# Patient Record
Sex: Female | Born: 1980 | Race: White | Hispanic: No | Smoking: Former smoker
Health system: Southern US, Community
[De-identification: ages and names within clinical notes are randomized; demographics above are authoritative.]

## PROBLEM LIST (undated history)

## (undated) DIAGNOSIS — F419 Anxiety disorder, unspecified: Secondary | ICD-10-CM

## (undated) DIAGNOSIS — Z9889 Other specified postprocedural states: Secondary | ICD-10-CM

## (undated) DIAGNOSIS — M797 Fibromyalgia: Secondary | ICD-10-CM

## (undated) DIAGNOSIS — F32A Depression, unspecified: Secondary | ICD-10-CM

## (undated) DIAGNOSIS — Z3480 Encounter for supervision of other normal pregnancy, unspecified trimester: Secondary | ICD-10-CM

## (undated) DIAGNOSIS — R87619 Unspecified abnormal cytological findings in specimens from cervix uteri: Secondary | ICD-10-CM

## (undated) DIAGNOSIS — R112 Nausea with vomiting, unspecified: Secondary | ICD-10-CM

## (undated) DIAGNOSIS — F329 Major depressive disorder, single episode, unspecified: Secondary | ICD-10-CM

## (undated) DIAGNOSIS — J45909 Unspecified asthma, uncomplicated: Secondary | ICD-10-CM

## (undated) DIAGNOSIS — M5020 Other cervical disc displacement, unspecified cervical region: Secondary | ICD-10-CM

## (undated) DIAGNOSIS — O24419 Gestational diabetes mellitus in pregnancy, unspecified control: Secondary | ICD-10-CM

## (undated) DIAGNOSIS — R51 Headache: Secondary | ICD-10-CM

## (undated) DIAGNOSIS — K589 Irritable bowel syndrome without diarrhea: Secondary | ICD-10-CM

## (undated) DIAGNOSIS — E669 Obesity, unspecified: Secondary | ICD-10-CM

## (undated) DIAGNOSIS — O09299 Supervision of pregnancy with other poor reproductive or obstetric history, unspecified trimester: Secondary | ICD-10-CM

## (undated) DIAGNOSIS — F909 Attention-deficit hyperactivity disorder, unspecified type: Secondary | ICD-10-CM

## (undated) DIAGNOSIS — K219 Gastro-esophageal reflux disease without esophagitis: Secondary | ICD-10-CM

## (undated) DIAGNOSIS — O139 Gestational [pregnancy-induced] hypertension without significant proteinuria, unspecified trimester: Secondary | ICD-10-CM

## (undated) DIAGNOSIS — R87629 Unspecified abnormal cytological findings in specimens from vagina: Secondary | ICD-10-CM

## (undated) DIAGNOSIS — IMO0002 Reserved for concepts with insufficient information to code with codable children: Secondary | ICD-10-CM

## (undated) DIAGNOSIS — G4730 Sleep apnea, unspecified: Secondary | ICD-10-CM

## (undated) HISTORY — DX: Anxiety disorder, unspecified: F41.9

## (undated) HISTORY — DX: Unspecified asthma, uncomplicated: J45.909

## (undated) HISTORY — DX: Headache: R51

## (undated) HISTORY — DX: Fibromyalgia: M79.7

## (undated) HISTORY — DX: Depression, unspecified: F32.A

## (undated) HISTORY — DX: Unspecified abnormal cytological findings in specimens from cervix uteri: R87.619

## (undated) HISTORY — DX: Reserved for concepts with insufficient information to code with codable children: IMO0002

## (undated) HISTORY — DX: Other specified postprocedural states: Z98.890

## (undated) HISTORY — DX: Unspecified abnormal cytological findings in specimens from vagina: R87.629

## (undated) HISTORY — PX: OTHER SURGICAL HISTORY: SHX169

## (undated) HISTORY — DX: Gestational diabetes mellitus in pregnancy, unspecified control: O24.419

## (undated) HISTORY — DX: Irritable bowel syndrome, unspecified: K58.9

## (undated) HISTORY — DX: Attention-deficit hyperactivity disorder, unspecified type: F90.9

## (undated) HISTORY — PX: COLPOSCOPY: SHX161

## (undated) HISTORY — DX: Major depressive disorder, single episode, unspecified: F32.9

## (undated) HISTORY — DX: Obesity, unspecified: E66.9

## (undated) HISTORY — DX: Gestational (pregnancy-induced) hypertension without significant proteinuria, unspecified trimester: O13.9

## (undated) HISTORY — DX: Other cervical disc displacement, unspecified cervical region: M50.20

## (undated) HISTORY — DX: Gastro-esophageal reflux disease without esophagitis: K21.9

## (undated) HISTORY — DX: Other specified postprocedural states: R11.2

## (undated) HISTORY — DX: Supervision of pregnancy with other poor reproductive or obstetric history, unspecified trimester: O09.299

## (undated) HISTORY — DX: Sleep apnea, unspecified: G47.30

---

## 2006-08-31 ENCOUNTER — Ambulatory Visit: Payer: Self-pay | Admitting: Psychiatry

## 2006-08-31 ENCOUNTER — Inpatient Hospital Stay (HOSPITAL_COMMUNITY): Admission: AD | Admit: 2006-08-31 | Discharge: 2006-09-02 | Payer: Self-pay | Admitting: Psychiatry

## 2006-09-03 ENCOUNTER — Other Ambulatory Visit (HOSPITAL_COMMUNITY): Admission: RE | Admit: 2006-09-03 | Discharge: 2006-09-24 | Payer: Self-pay | Admitting: Psychiatry

## 2008-01-27 ENCOUNTER — Ambulatory Visit (HOSPITAL_COMMUNITY): Admission: RE | Admit: 2008-01-27 | Discharge: 2008-01-27 | Payer: Self-pay | Admitting: Obstetrics and Gynecology

## 2008-05-12 ENCOUNTER — Inpatient Hospital Stay (HOSPITAL_COMMUNITY): Admission: AD | Admit: 2008-05-12 | Discharge: 2008-05-15 | Payer: Self-pay | Admitting: Obstetrics and Gynecology

## 2008-05-16 ENCOUNTER — Encounter: Admission: RE | Admit: 2008-05-16 | Discharge: 2008-06-12 | Payer: Self-pay | Admitting: Obstetrics and Gynecology

## 2008-06-13 ENCOUNTER — Encounter: Admission: RE | Admit: 2008-06-13 | Discharge: 2008-07-10 | Payer: Self-pay | Admitting: Obstetrics and Gynecology

## 2010-04-21 ENCOUNTER — Encounter: Payer: Self-pay | Admitting: Physical Medicine and Rehabilitation

## 2010-07-16 LAB — COMPREHENSIVE METABOLIC PANEL
ALT: 12 U/L (ref 0–35)
ALT: 13 U/L (ref 0–35)
AST: 22 U/L (ref 0–37)
AST: 29 U/L (ref 0–37)
Alkaline Phosphatase: 151 U/L — ABNORMAL HIGH (ref 39–117)
BUN: 6 mg/dL (ref 6–23)
CO2: 20 mEq/L (ref 19–32)
Calcium: 8.8 mg/dL (ref 8.4–10.5)
Chloride: 105 mEq/L (ref 96–112)
Chloride: 105 mEq/L (ref 96–112)
Creatinine, Ser: 0.69 mg/dL (ref 0.4–1.2)
GFR calc non Af Amer: >60 mL/min (ref >60)
GFR calc non Af Amer: >60 mL/min (ref >60)
Glucose, Bld: 141 mg/dL — ABNORMAL HIGH (ref 70–99)
Glucose, Bld: 77 mg/dL (ref 70–99)
Potassium: 3.9 mEq/L (ref 3.5–5.1)
Total Bilirubin: 0.2 mg/dL — ABNORMAL LOW (ref 0.3–1.2)
Total Bilirubin: 0.3 mg/dL (ref 0.3–1.2)
Total Protein: 6.5 g/dL (ref 6.0–8.3)

## 2010-07-16 LAB — URINALYSIS, ROUTINE W REFLEX MICROSCOPIC
Glucose, UA: NEGATIVE mg/dL
Ketones, ur: >80 mg/dL — AB
Specific Gravity, Urine: 1.02 (ref 1.005–1.030)
pH: 6.5 (ref 5.0–8.0)

## 2010-07-16 LAB — URINE MICROSCOPIC-ADD ON

## 2010-07-16 LAB — CBC
HCT: 32.3 % — ABNORMAL LOW (ref 36.0–46.0)
HCT: 38.6 % (ref 36.0–46.0)
Hemoglobin: 13.1 g/dL (ref 12.0–15.0)
MCHC: 34.5 g/dL (ref 30.0–36.0)
Platelets: 335 10*3/uL (ref 150–400)
Platelets: 384 10*3/uL (ref 150–400)
RBC: 3.62 MIL/uL — ABNORMAL LOW (ref 3.87–5.11)
RBC: 4.09 MIL/uL (ref 3.87–5.11)
RDW: 14.4 % (ref 11.5–15.5)
RDW: 14.4 % (ref 11.5–15.5)
WBC: 19 10*3/uL — ABNORMAL HIGH (ref 4.0–10.5)

## 2010-07-16 LAB — URINALYSIS, DIPSTICK ONLY
Glucose, UA: NEGATIVE mg/dL
Leukocytes, UA: NEGATIVE
Nitrite: NEGATIVE
Urobilinogen, UA: 0.2 mg/dL (ref 0.0–1.0)

## 2010-07-16 LAB — URIC ACID
Uric Acid, Serum: 5.4 mg/dL (ref 2.4–7.0)
Uric Acid, Serum: 6.3 mg/dL (ref 2.4–7.0)

## 2010-07-16 LAB — RPR: RPR Ser Ql: NONREACTIVE

## 2010-08-05 ENCOUNTER — Other Ambulatory Visit: Payer: Self-pay | Admitting: Allergy and Immunology

## 2010-08-05 ENCOUNTER — Ambulatory Visit
Admission: RE | Admit: 2010-08-05 | Discharge: 2010-08-05 | Disposition: A | Discharge status: Home / Self Care | Payer: Managed Care, Other (non HMO) | Source: Ambulatory Visit | Attending: Allergy and Immunology | Admitting: Allergy and Immunology

## 2010-08-05 DIAGNOSIS — J328 Other chronic sinusitis: Secondary | ICD-10-CM

## 2010-08-13 NOTE — Discharge Summary (Signed)
NAME:  Kristina Bauer, Kristina Bauer               ACCOUNT NO.:  [id number]   MEDICAL RECORD NO.:  [id number]          PATIENT TYPE:  INP   LOCATION:  9101                          FACILITY:  [facility]   PHYSICIAN:  Malachi Pro. Ambrose Mantle, M.D. DATE OF BIRTH:  1980-12-27   DATE OF ADMISSION:  05/12/2008  DATE OF DISCHARGE:  05/15/2008                               DISCHARGE SUMMARY   This is a 30 year old white single female, para 0-0-1-0, gravida 2, EDC  on May 13, 2008, admitted for induction with pregnancy-induced high  blood pressure and possible preeclampsia.  Blood group and type O  positive, negative antibody, nonreactive serology, rubella immune,  hepatitis B surface antigen negative, HIV negative, and GC and chlamydia  negative.  One-hour Glucola 157.  Three-hour GTT 87, 171, 116, and 81.  Group B strep negative.  First trimester screen was negative.  AFP  negative.  Vaginal ultrasound on October 06, 2007, crown-rump length 2.0 cm,  8 weeks 4 days, EDC on May 13, 2008.  Ultrasound on December 20, 2007, average gestational age [redacted] weeks 2 days, [facility] on May 13, 2008.  Echogenic intracardiac focus was noted.  The patient was seen at [facility].  Echogenic intracardiac focus noted or  otherwise normal.  Blood pressure was elevated in our office on the day  of admission with some proteinuria.  The patient was sent to the MAU.  Nonstress test was reactive with 1 variable deceleration.  PIH labs were  normal.  Dr. Ellyn Hack advised induction of labor.   PAST MEDICAL HISTORY:  ALLERGIES:  Allergy to LATEX caused a rash.  ILLNESSES:  Anxiety and depression and herniated cervical disk.   OPERATIONS:  Wisdom teeth extracted.  Alcohol, tobacco, and drugs none.   FAMILY HISTORY:  Father with high blood pressure and diabetes.  Mother  with anxiety and depression.  Maternal grandfather with heart disease.  Paternal grandfather with leukemia and diabetes.   FAMILY HISTORY:  Continued.   Paternal grandmother with colon cancer.  Maternal grandmother with breast cancer and diabetes.   OBSTETRICAL HISTORY:  In 2000, the patient had an early abortion.   PHYSICAL EXAMINATION:  VITAL SIGNS:  On admission, vital signs were  normal.  The blood pressure was 140/90.  HEART:  Normal.  LUNGS:  Normal.  ABDOMEN:  Soft.  Fundal height have been 40 cm on May 05, 2008.  Cervix was 1 plus centimeter, 50% per Dr. Ellyn Hack.   The patient was admitted for Pitocin.  The patient's Pitocin was started  and raised to 5 milliunits a minute.  It was stopped because of variable  decelerations.  At 4:50 a.m. the strip showed many decelerations to  below 100 beats per minute, none lasted longer than 1 minute.  We  restarted the Pitocin by 7:05 a.m.  The Pitocin was a 4 milliunits a  minute.  Contractions were hard to trace.  Variable decelerations were  present.  Cervix was 2 cm, 50% vertex at a -2 to -3 station.  Artificial  rupture of membranes produced clear fluid.  The patient rapidly  reached  full dilatation and received an epidural.  She pushed well and delivered  a living female infant, 7 pounds 4 ounces, Apgars were 8 at 1 and 9 at 5  minutes over a second-degree midline and right sulcus laceration.  Placenta was intact.  Uterus was normal.  Rectal was negative.  Laceration repaired with 2-0 Vicryl.  Blood loss about 400 mL.  Postpartum, the patient did well and was discharged on the second  postpartum day.  Her blood pressure was not an issue.  Urinalysis showed  30 mg percent protein greater than 80 percent ketones, otherwise was  negative.  Initial hemoglobin 13.1, hematocrit 39.6, white count 13,600,  and platelet count 386,000.  PIH labs were done and were normal.  Followup hemoglobins were 11.8 and 11.1.  PIH labs were done twice and  were normal both times.   FINAL DIAGNOSES:  1. Intrauterine pregnancy at term with pregnancy-induced high blood      pressure, possible  preeclampsia, delivered vertex.  2. Operation, spontaneous delivery vertex.  3. Repair of second-degree midline and right sulcus laceration.   FINAL CONDITION:  Improved.   INSTRUCTIONS:  Include our regular discharge instruction booklet.  The  patient is given a prescription for Motrin 600 mg 30 tablets 1 every 6  hours as needed for pain and Anusol-HC cream 30 g to apply locally twice  a day to the hemorrhoids.  She is to return in 6 weeks for followup  examination.      Malachi Pro. Ambrose Mantle, M.D.  Electronically Signed     TFH/MEDQ  D:  05/15/2008  T:  05/15/2008  Job:  [id number]

## 2010-09-16 ENCOUNTER — Inpatient Hospital Stay (INDEPENDENT_AMBULATORY_CARE_PROVIDER_SITE_OTHER)
Admission: RE | Admit: 2010-09-16 | Discharge: 2010-09-16 | Disposition: A | Discharge status: Home / Self Care | Payer: Managed Care, Other (non HMO) | Source: Ambulatory Visit | Attending: Emergency Medicine | Admitting: Emergency Medicine

## 2010-09-16 DIAGNOSIS — J019 Acute sinusitis, unspecified: Secondary | ICD-10-CM

## 2010-09-16 DIAGNOSIS — J310 Chronic rhinitis: Secondary | ICD-10-CM

## 2011-01-16 LAB — COMPREHENSIVE METABOLIC PANEL
ALT: 14
AST: 20
Albumin: 4.4
Alkaline Phosphatase: 49
BUN: 5 — ABNORMAL LOW
CO2: 28
Calcium: 9.4
Chloride: 100
Creatinine, Ser: 0.79
GFR calc Af Amer: >60
GFR calc non Af Amer: >60
Glucose, Bld: 96
Potassium: 3.3 — ABNORMAL LOW
Sodium: 138
Total Bilirubin: 0.8
Total Protein: 7.9

## 2011-01-16 LAB — URINALYSIS, ROUTINE W REFLEX MICROSCOPIC
Glucose, UA: NEGATIVE
Hgb urine dipstick: NEGATIVE
Ketones, ur: NEGATIVE
Leukocytes, UA: NEGATIVE
Nitrite: POSITIVE — AB
Protein, ur: NEGATIVE
Specific Gravity, Urine: 1.031 — ABNORMAL HIGH
Urobilinogen, UA: 1
pH: 5.5

## 2011-01-16 LAB — URINE MICROSCOPIC-ADD ON

## 2011-01-16 LAB — DRUGS OF ABUSE SCREEN W/O ALC, ROUTINE URINE
Amphetamine Screen, Ur: NEGATIVE
Barbiturate Quant, Ur: NEGATIVE
Propoxyphene: NEGATIVE

## 2011-01-16 LAB — CBC
HCT: 44.3
Hemoglobin: 15.3 — ABNORMAL HIGH
MCHC: 34.5
MCV: 93.7
Platelets: 354
RBC: 4.73
RDW: 11.8
WBC: 9.8

## 2011-01-16 LAB — TSH: TSH: 1.077

## 2011-04-23 ENCOUNTER — Ambulatory Visit: Payer: Managed Care, Other (non HMO) | Admitting: Family

## 2011-04-24 ENCOUNTER — Telehealth: Payer: Self-pay | Admitting: Family

## 2011-04-24 ENCOUNTER — Ambulatory Visit (INDEPENDENT_AMBULATORY_CARE_PROVIDER_SITE_OTHER): Payer: Managed Care, Other (non HMO) | Admitting: Family

## 2011-04-24 ENCOUNTER — Encounter: Payer: Self-pay | Admitting: Family

## 2011-04-24 VITALS — BP 108/78 | HR 82 | Temp 98.0°F | Resp 16 | Ht 64.25 in | Wt 230.0 lb

## 2011-04-24 DIAGNOSIS — R209 Unspecified disturbances of skin sensation: Secondary | ICD-10-CM

## 2011-04-24 DIAGNOSIS — R200 Anesthesia of skin: Secondary | ICD-10-CM

## 2011-04-24 DIAGNOSIS — G589 Mononeuropathy, unspecified: Secondary | ICD-10-CM

## 2011-04-24 DIAGNOSIS — M25449 Effusion, unspecified hand: Secondary | ICD-10-CM

## 2011-04-24 DIAGNOSIS — M7989 Other specified soft tissue disorders: Secondary | ICD-10-CM

## 2011-04-24 LAB — CBC WITH DIFFERENTIAL/PLATELET
Basophils Absolute: 0 10*3/uL (ref 0.0–0.1)
Eosinophils Absolute: 0.1 10*3/uL (ref 0.0–0.7)
HCT: 39.5 % (ref 36.0–46.0)
Hemoglobin: 13.7 g/dL (ref 12.0–15.0)
Lymphocytes Relative: 39.5 % (ref 12.0–46.0)
Lymphs Abs: 2.3 10*3/uL (ref 0.7–4.0)
MCHC: 34.6 g/dL (ref 30.0–36.0)
Monocytes Relative: 9.3 % (ref 3.0–12.0)
Neutro Abs: 2.9 10*3/uL (ref 1.4–7.7)
Platelets: 407 10*3/uL — ABNORMAL HIGH (ref 150.0–400.0)
RDW: 12.7 % (ref 11.5–14.6)

## 2011-04-24 LAB — BASIC METABOLIC PANEL
CO2: 23 mEq/L (ref 19–32)
Chloride: 109 mEq/L (ref 96–112)
Creatinine, Ser: 0.7 mg/dL (ref 0.4–1.2)
GFR: 103.25 mL/min (ref >60.00)
Glucose, Bld: 95 mg/dL (ref 70–99)
Potassium: 4 mEq/L (ref 3.5–5.1)
Sodium: 141 mEq/L (ref 135–145)

## 2011-04-24 LAB — TSH: TSH: 0.83 u[IU]/mL (ref 0.35–5.50)

## 2011-04-24 NOTE — Telephone Encounter (Signed)
Notified pt. 

## 2011-04-24 NOTE — Patient Instructions (Signed)
Cervical Radiculopathy Cervical radiculopathy is a pinched nerve in the neck. When this happens you may have pain or numbness shooting from your neck all the way down into your arm and fingers. There are many causes of this problem. Sometimes this may happen from an injury or simply from muscle tightness in the neck from overuse. It may also happen from arthritis or bone problems. If there is no improvement after treatment, further studies may be done to find the exact cause. DIAGNOSIS  X-rays may be needed if the problems become long standing. Electromyograms may be done. This study is one in which the working of nerves and muscles is studied. HOME CARE INSTRUCTIONS   Applications of ice packs may be helpful. Ice can be used in a plastic bag with a towel around it to prevent frostbite to skin. This may be used every 2 hours for 20 to 30 minutes, or as needed, while awake or as directed by your caregiver.   Sleep at night with a flat pillow.   Only take over-the-counter or prescription medicines for pain, discomfort, or fever as directed by your caregiver.   If physical therapy was prescribed, follow your caregiver's directions. If a cervical collar or cervical traction device was prescribed, use them as directed.  SEEK IMMEDIATE MEDICAL CARE IF:   You have pain not controlled with medications.   You seem to be getting worse rather than better.   You develop weakness in your hand or arm.   You develop new numbness or loss of feeling in your arms or legs.   You develop loss of bowel or bladder control.   You have difficulty with walking or balance, or develop clumsiness in the use of your arms or legs.  MAKE SURE YOU:   Understand these instructions.   Will watch your condition.   Will get help right away if you are not doing well or get worse.  Document Released: 12/10/2000 Document Revised: 09/30/2010 Document Reviewed: 07/21/2007 [employer] Patient Information 2012 [employer]. 

## 2011-04-24 NOTE — Telephone Encounter (Signed)
LMTCB

## 2011-04-24 NOTE — Progress Notes (Signed)
Subjective:    Patient ID: Kristina Bauer, female    DOB: 06-30-1980, 31 y.o.   MRN: [id number]  HPI 31 year old white female, new patient to the practice presents to be established. She has a history of a pinched nerve in her neck for which [facility]. She is currently taking naproxen 100 mg twice daily, tramadol 50 mg when necessary methocarbamol 500 mg at bedtime, and oral contraceptive pills.  Patient has concerns of bilateral hand numbness the right hand worse than the left considerable amount for several weeks, affecting her work. 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 She reports occasionally swollen joints but denies any warmness to touch. Patient also reports hair loss that's been going on for several weeks. Denies any past medical history of any thyroid disease. Denies any lightheadedness, dizziness, chest pain, palpitations, shortness of breath or edema.   As of note, patient does have a past medical history of depression and anxiety that require hospitalization and medications. She is not currently on any medicine  Review of Systems  Constitutional: Negative.   HENT: Positive for neck pain.   Respiratory: Negative.   Cardiovascular: Negative.   Musculoskeletal: Positive for joint swelling.       Hands  Neurological: Positive for numbness.       Hands bilaterally  Hematological: Negative.   Psychiatric/Behavioral: The patient is nervous/anxious.    No past medical history on file.  History   Social History  . Marital Status: Single    Spouse Name: N/A    Number of Children: N/A  . Years of Education: N/A   Occupational History  . Not on file.   Social History Main Topics  . Smoking status: Former Games developer  . Smokeless tobacco: Not on file  . Alcohol Use: Not on file  . Drug Use: Not on file  . Sexually Active: Not on file   Other Topics Concern  . Not on file   Social History Narrative  . No  narrative on file    No past surgical history on file.  No family history on file.  No Known Allergies  No current outpatient prescriptions on file prior to visit.    BP 108/78  Pulse 82  Temp(Src) 98 F (36.7 C) (Oral)  Resp 16  Ht 5' 4.25" (1.632 m)  Wt 230 lb (104.327 kg)  BMI 39.17 kg/m2  SpO2 98%chart    Objective:   Physical Exam  Constitutional: She is oriented to person, place, and time. She appears well-developed and well-nourished.  HENT:  Right Ear: External ear normal.  Left Ear: External ear normal.  Nose: Nose normal.  Mouth/Throat: Oropharynx is clear and moist.  Neck: Normal range of motion. Neck supple.  Cardiovascular: Normal rate, regular rhythm and normal heart sounds.   Pulmonary/Chest: Effort normal and breath sounds normal.  Abdominal: Soft. Bowel sounds are normal.  Musculoskeletal:       Pain to palpation of the cervical spine. Pain with range of motion. Both hands are normal normal grip strength. Radial pulses 2 out of 2.  Neurological: She is alert and oriented to person, place, and time.  Skin: Skin is warm and dry.  Psychiatric: She has a normal mood and affect.          Assessment & Plan:  Assessment: Multiple joint pain, cervical radiculopathy, bilateral hand numbness  Plan: Lab said to include ANA, RA, BMP, CBC, TSH Will notify patient of  the results of her labs. Encouraged healthy diet and exercise. Neck exercises. Patient is continue treatment through the [facility]. We will followup and manage any treatment conditions outside of her chronic neck and back pain.

## 2011-04-24 NOTE — Telephone Encounter (Signed)
The spine center is taking care of her neck pain. If she is under the care of a specialist, they should address her neck issue. I am not sure what we can offer her.

## 2011-04-24 NOTE — Telephone Encounter (Signed)
Pt called and was in as a np to see [name] re: pinched nerve. Pt called the doctor at the [facility] today and was told that there was nothing that they could do for the pts neck and that she would need to go to Urgent Care. Pt req call back from nurse.

## 2011-04-25 LAB — HIV ANTIBODY (ROUTINE TESTING W REFLEX): HIV: NONREACTIVE

## 2011-04-25 LAB — ANA: Anti Nuclear Antibody(ANA): NEGATIVE

## 2011-04-30 ENCOUNTER — Other Ambulatory Visit: Payer: Self-pay | Admitting: Orthopedic Surgery

## 2011-04-30 DIAGNOSIS — M542 Cervicalgia: Secondary | ICD-10-CM

## 2011-05-03 ENCOUNTER — Ambulatory Visit
Admission: RE | Admit: 2011-05-03 | Discharge: 2011-05-03 | Disposition: A | Discharge status: Home / Self Care | Payer: Managed Care, Other (non HMO) | Source: Ambulatory Visit | Attending: Orthopedic Surgery | Admitting: Orthopedic Surgery

## 2011-05-03 DIAGNOSIS — M542 Cervicalgia: Secondary | ICD-10-CM

## 2011-05-13 ENCOUNTER — Encounter: Payer: Self-pay | Admitting: Family

## 2011-05-13 ENCOUNTER — Ambulatory Visit (INDEPENDENT_AMBULATORY_CARE_PROVIDER_SITE_OTHER): Payer: Managed Care, Other (non HMO) | Admitting: Family

## 2011-05-13 VITALS — BP 126/98 | Temp 97.8°F | Wt 240.0 lb

## 2011-05-13 DIAGNOSIS — F418 Other specified anxiety disorders: Secondary | ICD-10-CM

## 2011-05-13 DIAGNOSIS — F341 Dysthymic disorder: Secondary | ICD-10-CM

## 2011-05-13 DIAGNOSIS — K219 Gastro-esophageal reflux disease without esophagitis: Secondary | ICD-10-CM

## 2011-05-13 MED ORDER — OMEPRAZOLE 40 MG PO CPDR: 40.0000 mg | DELAYED_RELEASE_CAPSULE | Freq: Every day | ORAL | Status: DC

## 2011-05-13 MED ORDER — DULOXETINE HCL 30 MG PO CPEP: 30.0000 mg | ORAL_CAPSULE | Freq: Every day | ORAL | Status: DC

## 2011-05-13 NOTE — Progress Notes (Signed)
Subjective:    Patient ID: Kristina Bauer, female    DOB: Aug 10, 1980, 31 y.o.   MRN: [id number]  HPI The 31 year old white female who nonsmoker, is in for management of her depression and anxiety. Patient has a long-standing history of depression and anxiety is not currently on any medications. She also has a history of chronic neck pain and is being monitored by the pain management clinic and physical therapy. She denies any frontal helplessness or hopelessness, no thoughts of death or dying. Patient does have crying spells. She also believes that she has an eating disorder, eating excessively for comfort. She's not currently exercising or following any particular diet. She is requesting psychotherapy.  Patient also has a history of GERD but has not been treated. She reports daily episodes of abdominal pressure and bloating. She also reports a sore throat and painful swallowing. She has episodes of diarrhea and constipation that has not. She denies any nausea or vomiting.  Review of Systems  Constitutional: Negative.   HENT: Positive for sore throat. Negative for congestion, rhinorrhea, sneezing and postnasal drip.   Respiratory: Negative.   Cardiovascular: Negative.   Gastrointestinal: Positive for abdominal pain.  Musculoskeletal: Positive for arthralgias.       Chronic neck pain  Skin: Negative.   Neurological: Negative.   Psychiatric/Behavioral: The patient is nervous/anxious.    No past medical history on file.  History   Social History  . Marital Status: Single    Spouse Name: N/A    Number of Children: N/A  . Years of Education: N/A   Occupational History  . Not on file.   Social History Main Topics  . Smoking status: Former Games developer  . Smokeless tobacco: Not on file  . Alcohol Use: Not on file  . Drug Use: Not on file  . Sexually Active: Not on file   Other Topics Concern  . Not on file   Social History Narrative  . No narrative on file    No past surgical  history on file.  No family history on file.  No Known Allergies  Current Outpatient Prescriptions on File Prior to Visit  Medication Sig Dispense Refill  . methocarbamol (ROBAXIN) 500 MG tablet Take 500 mg by mouth as needed.      . naproxen (NAPROSYN) 500 MG tablet Take 500 mg by mouth 2 (two) times daily with a meal.      . NON FORMULARY Take by mouth daily. Portia BC.      . traMADol (ULTRAM) 50 MG tablet Take 50 mg by mouth every 6 (six) hours as needed.        BP 126/98  Temp(Src) 97.8 F (36.6 C) (Oral)  Wt 240 lb (108.863 kg)chart and     Objective:   Physical Exam  Constitutional: She is oriented to person, place, and time. She appears well-developed and well-nourished.  Neck: Normal range of motion. Neck supple.  Cardiovascular: Normal rate, regular rhythm and normal heart sounds.   Pulmonary/Chest: Effort normal and breath sounds normal.  Abdominal: Soft. Bowel sounds are normal.  Neurological: She is alert and oriented to person, place, and time.  Skin: Skin is warm and dry.          Assessment & Plan:  Assessment: GERD, anxiety and depression  Plan: Start Cymbalta 30 mg by mouth daily x1 week then increase to 60 mg by mouth daily to help with anxiety and depression as well as her chronic pain. We'll start omeprazole 40 mg  once daily in the morning to help with GERD. Also gave instructions on dietary changes. Encouraged healthy diet and exercise. Patient will consult with her insurance company to determine what therapies are all formulary for her and schedule an appointment to further discuss her anxiety and depression as well as the possibility of the eating disorder.

## 2011-05-13 NOTE — Patient Instructions (Addendum)
Depression, Adolescent and Adult Depression is a true and treatable medical condition. In general there are two kinds of depression:  Depression we all experience in some form. For example depression from the death of a loved one, financial distress or natural disasters will trigger or increase depression.   Clinical depression, on the other hand, appears without an apparent cause or reason. This depression is a disease. Depression may be caused by chemical imbalance in the body and brain or may come as a response to a physical illness. Alcohol and other drugs can cause depression.  DIAGNOSIS  The diagnosis of depression is usually based upon symptoms and medical history. TREATMENT  Treatments for depression fall into three categories. These are:  Drug therapy. There are many medicines that treat depression. Responses may vary and sometimes trial and error is necessary to determine the best medicines and dosage for a particular patient.   Psychotherapy, also called talking treatments, helps people resolve their problems by looking at them from a different point of view and by giving people insight into their own personal makeup. Traditional psychotherapy looks at a childhood source of a problem. Other psychotherapy will look at current conflicts and move toward solving those. If the cause of depression is drug use, counseling is available to help abstain. In time the depression will usually improve. If there were underlying causes for the chemical use, they can be addressed.   ECT (electroconvulsive therapy) or shock treatment is not as commonly used today. It is a very effective treatment for severe suicidal depression. During ECT electrical impulses are applied to the head. These impulses cause a generalized seizure. It can be effective but causes a loss of memory for recent events. Sometimes this loss of memory may include the last several months.  Treat all depression or suicide threats as  serious. Obtain professional help. Do not wait to see if serious depression will get better over time without help. Seek help for yourself or those around you. In the [place] the number to the National Suicide Help Lines With 24 Hour Help Are: 1-800-SUICIDE (737)243-2677 Document Released: 03/14/2000 Document Revised: 11/27/2010 Document Reviewed: 11/03/2007 [facility] Patient Information 2012 [place], [place].  Diet for GERD or PUD Nutrition therapy can help ease the discomfort of gastroesophageal reflux disease (GERD) and peptic ulcer disease (PUD).  HOME CARE INSTRUCTIONS   Eat your meals slowly, in a relaxed setting.   Eat 5 to 6 small meals per day.   If a food causes distress, stop eating it for a period of time.  FOODS TO AVOID  Coffee, regular or decaffeinated.   Cola beverages, regular or low calorie.   Tea, regular or decaffeinated.   Pepper.   Cocoa.   High fat foods, including meats.   Butter, margarine, hydrogenated oil (trans fats).   Peppermint or spearmint (if you have GERD).   Fruits and vegetables if not tolerated.   Alcohol.   Nicotine (smoking or chewing). This is one of the most potent stimulants to acid production in the gastrointestinal tract.   Any food that seems to aggravate your condition.  If you have questions regarding your diet, ask your caregiver or a registered dietitian. TIPS  Lying flat may make symptoms worse. Keep the head of your bed raised 6 to 9 inches (15 to 23 cm) by using a foam wedge or blocks under the legs of the bed.   Do not lay down until 3 hours after eating a meal.   Daily physical activity  may help reduce symptoms.  MAKE SURE YOU:   Understand these instructions.   Will watch your condition.   Will get help right away if you are not doing well or get worse.  Document Released: 03/17/2005 Document Revised: 11/27/2010 Document Reviewed: 07/31/2008 [facility] Patient Information 2012 [place], [place].

## 2011-06-03 ENCOUNTER — Encounter: Payer: Self-pay | Admitting: Family

## 2011-06-03 ENCOUNTER — Ambulatory Visit (INDEPENDENT_AMBULATORY_CARE_PROVIDER_SITE_OTHER): Payer: Managed Care, Other (non HMO) | Admitting: Family

## 2011-06-03 VITALS — BP 128/90 | Temp 98.2°F | Wt 240.0 lb

## 2011-06-03 DIAGNOSIS — K219 Gastro-esophageal reflux disease without esophagitis: Secondary | ICD-10-CM

## 2011-06-03 DIAGNOSIS — F341 Dysthymic disorder: Secondary | ICD-10-CM

## 2011-06-03 DIAGNOSIS — F329 Major depressive disorder, single episode, unspecified: Secondary | ICD-10-CM

## 2011-06-03 DIAGNOSIS — F988 Other specified behavioral and emotional disorders with onset usually occurring in childhood and adolescence: Secondary | ICD-10-CM

## 2011-06-03 NOTE — Progress Notes (Signed)
Subjective:    Patient ID: Kristina Bauer, female    DOB: 1980-10-13, 31 y.o.   MRN: [id number]  HPI 31 year old white female, nonsmoker, anxiety, depression and GERD. She's currently taking Cymbalta 60 mg a day. Omeprazole 40 mg a day. Patient is tolerating her medications well. Since seeing me, she seen psychiatry and the therapist to have continued her medications but also diagnosed her with attention deficit disorder. Her psychiatrist also believes that she may have fibromyalgia. She's also seeing the pain clinic is managing her chronic pain condition. Patient denies any feelings of helplessness, hopelessness no thoughts of death or dying. Assess    Review of Systems  Constitutional: Negative.   Respiratory: Negative.   Cardiovascular: Negative.   Genitourinary: Negative.   Musculoskeletal: Negative.   Neurological: Negative.   Psychiatric/Behavioral: Negative.  The patient is not nervous/anxious.    No past medical history on file.  History   Social History  . Marital Status: Single    Spouse Name: N/A    Number of Children: N/A  . Years of Education: N/A   Occupational History  . Not on file.   Social History Main Topics  . Smoking status: Former Games developer  . Smokeless tobacco: Not on file  . Alcohol Use: Not on file  . Drug Use: Not on file  . Sexually Active: Not on file   Other Topics Concern  . Not on file   Social History Narrative  . No narrative on file    No past surgical history on file.  No family history on file.  No Known Allergies  Current Outpatient Prescriptions on File Prior to Visit  Medication Sig Dispense Refill  . DULoxetine (CYMBALTA) 30 MG capsule Take 1 capsule (30 mg total) by mouth daily. 1 tab po QD x 7 days then increase to 2 tabs.  30 capsule  0  . methocarbamol (ROBAXIN) 500 MG tablet Take 500 mg by mouth as needed.      Marland Kitchen omeprazole (PRILOSEC) 40 MG capsule Take 1 capsule (40 mg total) by mouth daily.  30 capsule  3  .  gabapentin (NEURONTIN) 300 MG capsule       . naproxen (NAPROSYN) 500 MG tablet Take 500 mg by mouth 2 (two) times daily with a meal.      . NON FORMULARY Take by mouth daily. Portia BC.      Marland Kitchen PORTIA-28 0.15-30 MG-MCG tablet       . traMADol (ULTRAM) 50 MG tablet Take 50 mg by mouth every 6 (six) hours as needed.      Marland Kitchen TREXIMET 85-500 MG per tablet         BP 128/90  Temp(Src) 98.2 F (36.8 C) (Oral)  Wt 240 lb (108.863 kg)chart    Objective:   Physical Exam  Constitutional: She is oriented to person, place, and time. She appears well-developed and well-nourished.  HENT:  Right Ear: External ear normal.  Left Ear: External ear normal.  Nose: Nose normal.  Mouth/Throat: Oropharynx is clear and moist.  Neck: Normal range of motion. Neck supple.  Cardiovascular: Normal rate, regular rhythm and normal heart sounds.   Pulmonary/Chest: Effort normal and breath sounds normal.  Abdominal: Soft. Bowel sounds are normal.  Musculoskeletal: Normal range of motion.  Neurological: She is alert and oriented to person, place, and time.  Skin: Skin is warm and dry.  Psychiatric: She has a normal mood and affect.          Assessment &  Plan:  Assessment: Anxiety, depression, GERD, and attention deficit disorder  Plan: Continue all medications as prescribed. The patient is following up with psychiatry, therapies, and the pain Center, we will recheck her here in 6 months and sooner when necessary.

## 2011-06-12 LAB — OB RESULTS CONSOLE GBS: GBS: NEGATIVE

## 2011-08-12 ENCOUNTER — Telehealth: Payer: Self-pay | Admitting: *Deleted

## 2011-08-12 NOTE — Telephone Encounter (Signed)
Pt is taking omeprazole and her acid reflux is getting worse.  She looses her medical insurance on 08/29/11.  She wants to know if Oran Rein will give her something else or if she needs to come in for another OV

## 2011-08-13 ENCOUNTER — Other Ambulatory Visit: Payer: Self-pay | Admitting: Family

## 2011-08-13 MED ORDER — ESOMEPRAZOLE MAGNESIUM 40 MG PO CPDR: 40.0000 mg | DELAYED_RELEASE_CAPSULE | Freq: Every day | ORAL | Status: DC

## 2011-08-13 NOTE — Telephone Encounter (Signed)
I will fax a RX for nexium 40mg  a day.

## 2011-08-19 ENCOUNTER — Telehealth: Payer: Self-pay

## 2011-08-19 NOTE — Telephone Encounter (Signed)
Triage VM:  Pt called and states she was given Nexium for acid reflux but pt would like to make sure it is a medication on the preferred [employer] List due to losing health insurance on 08/29/11.  Pls advise.

## 2011-08-19 NOTE — Telephone Encounter (Signed)
Nexium is not on [employer]'s $4 list. Zantac is but is a much weaker medication. Can use OTC Prilosec if necessary.

## 2011-08-19 NOTE — Telephone Encounter (Signed)
Pt aware.

## 2011-08-20 NOTE — Telephone Encounter (Signed)
Left a message for pt to return call 

## 2011-08-22 NOTE — Telephone Encounter (Signed)
Left a message for pt to return call 

## 2011-12-04 ENCOUNTER — Ambulatory Visit: Payer: Managed Care, Other (non HMO) | Admitting: Family

## 2011-12-24 LAB — OB RESULTS CONSOLE RPR: RPR: NONREACTIVE

## 2011-12-24 LAB — OB RESULTS CONSOLE ANTIBODY SCREEN: Antibody Screen: NEGATIVE

## 2011-12-24 LAB — OB RESULTS CONSOLE GC/CHLAMYDIA: Gonorrhea: NEGATIVE

## 2011-12-24 LAB — OB RESULTS CONSOLE HEPATITIS B SURFACE ANTIGEN: Hepatitis B Surface Ag: NEGATIVE

## 2011-12-24 LAB — OB RESULTS CONSOLE ABO/RH: RH Type: POSITIVE

## 2012-01-09 ENCOUNTER — Encounter: Payer: Self-pay | Admitting: *Deleted

## 2012-01-09 ENCOUNTER — Encounter: Payer: Medicaid Other | Attending: Obstetrics and Gynecology | Admitting: *Deleted

## 2012-01-09 DIAGNOSIS — Z713 Dietary counseling and surveillance: Secondary | ICD-10-CM | POA: Insufficient documentation

## 2012-01-09 DIAGNOSIS — E669 Obesity, unspecified: Secondary | ICD-10-CM | POA: Insufficient documentation

## 2012-01-09 NOTE — Patient Instructions (Addendum)
Plan: Aim for 3 Carb choices (45 grams) per meal +/- 1  Aim for 1-2 Carbs per snack if hungry Consider having a protein source with meals and snacks as able Consider trying Carnation Essential as meal replacement option Continue walking as able daily  Consider reading food labels for total carb and fat grams of foods

## 2012-01-09 NOTE — Progress Notes (Signed)
  Medical Nutrition Therapy:  Appt start time: 0800 end time:  0900.   Assessment:  Primary concerns today: patient here for weight management during pregnancy. She is [redacted] weeks pregnant and has some queasy moments that effect food choices. She is under a tremendous amount of stress with being unemployed and her benefits may be expiring soon, she has past medical bills to pay off and she states she has been to over 50 interviews with no success in getting a job.   MEDICATIONS: see list   DIETARY INTAKE:  Usual eating pattern includes 2-3 meals and 0-2 snacks per day.  Everyday foods include variety of all food groups.  Avoided foods include plain milk, most meats right now, some vegetables.    24-hr recall:  B ( AM): tries to eat; dry cereal - sweetened or unsweetened without milk, occasionally OJ or water Snk ( AM): none  L ( PM): fast food; nacho [place] and taco, regular soda Snk ( PM): none D ( PM): some meat occasionally, beans, occasionally a vegetable,  Snk ( PM): occasionally chips Beverages: water, some juice, regular soda  Usual physical activity: tries to walk and go to gym walking on treadmill and stationary bike as able limited by migraines and neck pain  Estimated energy needs: 2000 calories 225 g carbohydrates 150 g protein 56 g fat  Progress Towards Goal(s):  In progress.   Nutritional Diagnosis:  NB-1.1 Food and nutrition-related knowledge deficit As related to weight maintenance during pregnancy.  As evidenced by BMI of 42.7 .    Intervention:  Nutrition counseling for weight maintenance during pregnancy initiated. Discussed Carb Counting as well as value of Protein and Fat, the reading of food labels, and benefits of consistent daily activity. Reviewed how her usual fast food choices add up with carb and fat content and what some alternative choices might be. Also discussed grocery shopping list and value of using [employer]orie [employer] APP for nutrition  information.  Plan: Aim for 3 Carb choices (45 grams) per meal +/- 1  Aim for 1-2 Carbs per snack if hungry Consider having a protein source with meals and snacks as able Consider trying Carnation Essential as meal replacement option Continue walking as able daily  Consider reading food labels for total carb and fat grams of foods  Handouts given during visit include: Carb Counting and Food Label handouts Meal Plan Card  APP for [employer]orie [facility]  Monitoring/Evaluation:  Dietary intake, exercise, reading food labels, and body weight in 4 week(s).

## 2012-02-09 ENCOUNTER — Ambulatory Visit: Payer: Medicaid Other | Admitting: *Deleted

## 2012-03-31 NOTE — L&D Delivery Note (Signed)
Delivery Note At 12:30 PM a viable female was delivered via Vaginal, Spontaneous Delivery (Presentation: Left Occiput Anterior).  APGAR: 9, 9; weight P.   Placenta status: Intact, Spontaneous.  Cord: 3 vessels with the following complications: None.    Anesthesia: Epidural  Episiotomy: None Lacerations: 2nd degree Suture Repair: 3.0 vicryl rapide Est. Blood Loss (mL): 400  Mom to postpartum.  Baby to stay with mommy and daddy.  Bauer,Kristina Obremski 07/09/2012, 12:49 PM  Br/O+/IUD PP

## 2012-06-04 LAB — OB RESULTS CONSOLE GBS: GBS: NEGATIVE

## 2012-06-25 ENCOUNTER — Telehealth: Payer: Self-pay | Admitting: Neurology

## 2012-06-25 NOTE — Telephone Encounter (Signed)
Patient called and left voice massage on [employer]. She is [redacted] weeks gestation and went in today for an MRI today but could not complete it because she could not lie flat long enough due to her pregnancy status. Patient is concerned that she is on Pregnancy Medicaid and is due April 14 and has an appointment with Dr. Pearlean Brownie on July 20, 2012 and does not know if she will still have her Pregnancy Medicaid if she has to delay her neuro appointment if she has to delay her MRI.   I called and spoke with the patient and advised her that her medicaid is effective for six weeks post-partum. She will try to reschedule the MRI for July 19, 2012 or as close to that date as possible. If she has to reschedule her appointment with [facility], she will call us back and we will have to keep in consideration that her appointment has to be made within that six week window of her delivery date.

## 2012-06-25 NOTE — Telephone Encounter (Signed)
Note sent to [facility] in MRI re: rescheduling.

## 2012-06-30 ENCOUNTER — Telehealth: Payer: Self-pay | Admitting: Neurology

## 2012-06-30 ENCOUNTER — Other Ambulatory Visit: Payer: Self-pay | Admitting: Neurology

## 2012-06-30 DIAGNOSIS — F410 Panic disorder [episodic paroxysmal anxiety] without agoraphobia: Secondary | ICD-10-CM

## 2012-06-30 MED ORDER — ALPRAZOLAM 0.5 MG PO TABS: 0.5000 mg | ORAL_TABLET | Freq: Every evening | ORAL | Status: DC | PRN

## 2012-06-30 NOTE — Telephone Encounter (Signed)
Pt called again and is asking about her MRI being rescheduled after her having her baby and from her OB stating this could happen this week or next.  She stated that she will need xanax due to claustrophobia. (taking this after she has baby, but she may breastfeed).  Please advise.   Pt to call and reschedule appt. For MRI

## 2012-06-30 NOTE — Telephone Encounter (Signed)
I prescibed xanax 0.5 mg two tablets

## 2012-07-01 NOTE — Telephone Encounter (Signed)
I called pt and she will p/u at her pharmacy later this afternoon.  Richeda had called pt already.

## 2012-07-05 ENCOUNTER — Telehealth (HOSPITAL_COMMUNITY): Payer: Self-pay | Admitting: *Deleted

## 2012-07-05 ENCOUNTER — Encounter (HOSPITAL_COMMUNITY): Payer: Self-pay | Admitting: *Deleted

## 2012-07-05 NOTE — Telephone Encounter (Signed)
Preadmission screen  

## 2012-07-08 ENCOUNTER — Encounter (HOSPITAL_COMMUNITY): Payer: Self-pay

## 2012-07-08 DIAGNOSIS — Z3480 Encounter for supervision of other normal pregnancy, unspecified trimester: Secondary | ICD-10-CM

## 2012-07-08 HISTORY — DX: Encounter for supervision of other normal pregnancy, unspecified trimester: Z34.80

## 2012-07-08 MED ORDER — PRENATAL 27-0.8 MG PO TABS
1.0000 | ORAL_TABLET | Freq: Every day | ORAL | Status: DC
  Filled 2012-07-08 (×2): qty 1

## 2012-07-08 MED ORDER — PANTOPRAZOLE SODIUM 40 MG PO TBEC
40.0000 mg | DELAYED_RELEASE_TABLET | Freq: Every day | ORAL | Status: DC
  Filled 2012-07-08 (×3): qty 1

## 2012-07-08 NOTE — H&P (Signed)
Kristina Bauer is a 32 y.o. female G3P1011 at 39+ for IOL given term status and favorable cervix.  +FM, no LOF, no VB, occ ctx; pregnancy relatively uncomplicated, maternal h/o fibromyalgia, Adderall use, HA/migraine, IBS, fibromyalgoa, ADHD, Depression, herniated disc - cervical, GERD.  D/w pt r/b/a of IOL and POC.  Maternal Medical History:  Contractions: Frequency: irregular.   Perceived severity is moderate.    Fetal activity: Perceived fetal activity is normal.    Prenatal complications: no prenatal complications Prenatal Complications - Diabetes: none.    OB History   Grav Para Term Preterm Abortions TAB SAB Ect Mult Living   3 1 1  1  1   1     G1 TAB, G2 SVD 7#4 female, PIH, PreE, G3 present, h/o abn pap, no STDs  Past Medical History  Diagnosis Date  . GERD (gastroesophageal reflux disease)   . Obesity   . Anxiety   . Depression   . Hx of pre-eclampsia in prior pregnancy, currently pregnant   . Fibromyalgia   . IBS (irritable bowel syndrome)   . Abnormal Pap smear   . ADHD (attention deficit hyperactivity disorder)   . Asthma   . Headache   . Herniated disc, cervical   . Normal pregnancy, repeat 07/08/2012   Past Surgical History  Procedure Laterality Date  . Wisdom teeth extracted    . Colposcopy     Family History: family history includes Anxiety disorder in her mother; Cancer in her father, maternal grandmother, paternal grandfather, and paternal grandmother; Depression in her mother; Diabetes in her father, maternal grandmother, and paternal grandfather; Fibromyalgia in her sister; Heart disease in her maternal grandfather; and Hypertension in her father and mother. Social History:  reports that she quit smoking about 4 years ago. She has never used smokeless tobacco. She reports that she does not drink alcohol or use illicit drugs.single Meds cyclobenzaprene, PNV,Protonix All Latex, NKDA   Prenatal Transfer Tool  Maternal Diabetes: No Genetic Screening:  Normal Maternal Ultrasounds/Referrals: Normal Fetal Ultrasounds or other Referrals:  None Maternal Substance Abuse:  No Significant Maternal Medications:  None Significant Maternal Lab Results:  Lab values include: Group B Strep negative Other Comments:  Multiple maternal medical problems: h/o migraine, fibromyalgia, GERD, ADHD, Deoression, Adderall use, Asthma, IBS  Review of Systems  Constitutional: Negative.   HENT: Negative.   Eyes: Negative.   Respiratory: Negative.   Cardiovascular: Negative.   Gastrointestinal: Negative.   Genitourinary: Negative.   Musculoskeletal: Negative.   Skin: Negative.   Neurological: Negative.   Psychiatric/Behavioral: Negative.       Last menstrual period 10/06/2011. Maternal Exam:  Uterine Assessment: Contraction frequency is irregular.   Abdomen: Fundal height is appropriate for gestation.   Estimated fetal weight is 7-8#.   Fetal presentation: vertex  Introitus: Normal vulva. Normal vagina.  Pelvis: adequate for delivery.   Cervix: Cervix evaluated by digital exam.     Physical Exam  Constitutional: She is oriented to person, place, and time. She appears well-developed and well-nourished.  HENT:  Head: Normocephalic and atraumatic.  Cardiovascular: Normal rate and regular rhythm.   Respiratory: Effort normal and breath sounds normal. No respiratory distress.  GI: Soft. Bowel sounds are normal. There is no tenderness.  Musculoskeletal: Normal range of motion.  Neurological: She is alert and oriented to person, place, and time.  Skin: Skin is warm and dry.  Psychiatric: She has a normal mood and affect. Her behavior is normal.    Prenatal labs: ABO, Rh:  O/Positive/-- (09/25 0000) Antibody: Negative (09/25 0000) Rubella: Immune (09/25 0000) RPR: Nonreactive (09/25 0000)  HBsAg: Negative (09/25 0000)  HIV: Non-reactive (09/25 0000)  GBS: Negative (03/07 0000)   hgb 12.1/ Pap WNL/ Plt 459K/ GC neg/ Chl neg/ First Tri WNL/ AFP  WNL/ CF neg/ glucola 132/   Tdap 1/17; flu 9/25  10 wk [place] cwd, EDC 4/15, nl NT previa Anat, limited heart, o/w WNL post plac, previa resolved, female F/u nl anat  SVE 2/50/-2 Assessment/Plan: 32 yo G3P1011 at 39+ for IOL  AROM/pitocin gbbs neg - no prophylaxis Epidural prn Expect SVD   BOVARD,Pearley Millington 07/08/2012, 10:26 PM

## 2012-07-09 ENCOUNTER — Inpatient Hospital Stay (HOSPITAL_COMMUNITY)
Admission: RE | Admit: 2012-07-09 | Discharge: 2012-07-11 | DRG: 775 | Disposition: A | Discharge status: Home / Self Care | Payer: Medicaid Other | Source: Ambulatory Visit | Attending: Obstetrics and Gynecology | Admitting: Obstetrics and Gynecology

## 2012-07-09 ENCOUNTER — Encounter (HOSPITAL_COMMUNITY): Payer: Self-pay

## 2012-07-09 ENCOUNTER — Encounter (HOSPITAL_COMMUNITY): Payer: Self-pay | Admitting: Anesthesiology

## 2012-07-09 ENCOUNTER — Inpatient Hospital Stay (HOSPITAL_COMMUNITY): Payer: Medicaid Other | Admitting: Anesthesiology

## 2012-07-09 VITALS — BP 106/71 | HR 84 | Temp 98.3°F | Resp 18 | Ht 64.0 in | Wt 270.0 lb

## 2012-07-09 DIAGNOSIS — K219 Gastro-esophageal reflux disease without esophagitis: Secondary | ICD-10-CM | POA: Diagnosis present

## 2012-07-09 DIAGNOSIS — Z3480 Encounter for supervision of other normal pregnancy, unspecified trimester: Secondary | ICD-10-CM

## 2012-07-09 DIAGNOSIS — O99892 Other specified diseases and conditions complicating childbirth: Secondary | ICD-10-CM | POA: Diagnosis present

## 2012-07-09 DIAGNOSIS — O99344 Other mental disorders complicating childbirth: Secondary | ICD-10-CM | POA: Diagnosis present

## 2012-07-09 DIAGNOSIS — IMO0001 Reserved for inherently not codable concepts without codable children: Secondary | ICD-10-CM | POA: Diagnosis present

## 2012-07-09 DIAGNOSIS — F329 Major depressive disorder, single episode, unspecified: Secondary | ICD-10-CM | POA: Diagnosis present

## 2012-07-09 DIAGNOSIS — F3289 Other specified depressive episodes: Secondary | ICD-10-CM | POA: Diagnosis present

## 2012-07-09 DIAGNOSIS — Z3483 Encounter for supervision of other normal pregnancy, third trimester: Secondary | ICD-10-CM

## 2012-07-09 HISTORY — DX: Encounter for supervision of other normal pregnancy, unspecified trimester: Z34.80

## 2012-07-09 LAB — CBC
Hemoglobin: 11.1 g/dL — ABNORMAL LOW (ref 12.0–15.0)
MCHC: 33.2 g/dL (ref 30.0–36.0)
WBC: 11.5 10*3/uL — ABNORMAL HIGH (ref 4.0–10.5)

## 2012-07-09 LAB — ABO/RH: ABO/RH(D): O POS

## 2012-07-09 LAB — RPR: RPR Ser Ql: NONREACTIVE

## 2012-07-09 MED ORDER — LANOLIN HYDROUS EX OINT: TOPICAL_OINTMENT | CUTANEOUS | Status: DC | PRN

## 2012-07-09 MED ORDER — FENTANYL 2.5 MCG/ML BUPIVACAINE 1/10 % EPIDURAL INFUSION (WH - ANES)
14.0000 mL/h | INTRAMUSCULAR | Status: DC | PRN
  Administered 2012-07-09: 14 mL/h via EPIDURAL
  Filled 2012-07-09: qty 125

## 2012-07-09 MED ORDER — BENZOCAINE-MENTHOL 20-0.5 % EX AERO
1.0000 "application " | INHALATION_SPRAY | CUTANEOUS | Status: DC | PRN
  Administered 2012-07-09: 1 via TOPICAL
  Filled 2012-07-09: qty 56

## 2012-07-09 MED ORDER — DIBUCAINE 1 % RE OINT: 1.0000 "application " | TOPICAL_OINTMENT | RECTAL | Status: DC | PRN

## 2012-07-09 MED ORDER — WITCH HAZEL-GLYCERIN EX PADS: 1.0000 "application " | MEDICATED_PAD | CUTANEOUS | Status: DC | PRN

## 2012-07-09 MED ORDER — LACTATED RINGERS IV SOLN
500.0000 mL | INTRAVENOUS | Status: DC | PRN
  Administered 2012-07-09: 1000 mL via INTRAVENOUS

## 2012-07-09 MED ORDER — ONDANSETRON HCL 4 MG/2ML IJ SOLN: 4.0000 mg | Freq: Four times a day (QID) | INTRAMUSCULAR | Status: DC | PRN

## 2012-07-09 MED ORDER — CYCLOBENZAPRINE HCL 5 MG PO TABS
5.0000 mg | ORAL_TABLET | Freq: Three times a day (TID) | ORAL | Status: DC | PRN
  Filled 2012-07-09: qty 1

## 2012-07-09 MED ORDER — SIMETHICONE 80 MG PO CHEW: 80.0000 mg | CHEWABLE_TABLET | ORAL | Status: DC | PRN

## 2012-07-09 MED ORDER — DIPHENHYDRAMINE HCL 50 MG/ML IJ SOLN: 12.5000 mg | INTRAMUSCULAR | Status: DC | PRN

## 2012-07-09 MED ORDER — DIPHENHYDRAMINE HCL 25 MG PO CAPS: 25.0000 mg | ORAL_CAPSULE | Freq: Four times a day (QID) | ORAL | Status: DC | PRN

## 2012-07-09 MED ORDER — OXYCODONE-ACETAMINOPHEN 5-325 MG PO TABS
1.0000 | ORAL_TABLET | ORAL | Status: DC | PRN
  Administered 2012-07-10 – 2012-07-11 (×5): 1 via ORAL
  Filled 2012-07-09 (×2): qty 1

## 2012-07-09 MED ORDER — IBUPROFEN 600 MG PO TABS
600.0000 mg | ORAL_TABLET | Freq: Four times a day (QID) | ORAL | Status: DC | PRN
  Filled 2012-07-09 (×3): qty 1

## 2012-07-09 MED ORDER — PRENATAL MULTIVITAMIN CH
1.0000 | ORAL_TABLET | Freq: Every day | ORAL | Status: DC
  Administered 2012-07-10: 1 via ORAL
  Filled 2012-07-09 (×2): qty 1

## 2012-07-09 MED ORDER — PHENYLEPHRINE 40 MCG/ML (10ML) SYRINGE FOR IV PUSH (FOR BLOOD PRESSURE SUPPORT)
80.0000 ug | PREFILLED_SYRINGE | INTRAVENOUS | Status: DC | PRN
  Filled 2012-07-09: qty 5
  Filled 2012-07-09: qty 2

## 2012-07-09 MED ORDER — TETANUS-DIPHTH-ACELL PERTUSSIS 5-2.5-18.5 LF-MCG/0.5 IM SUSP: 0.5000 mL | Freq: Once | INTRAMUSCULAR | Status: DC

## 2012-07-09 MED ORDER — SENNOSIDES-DOCUSATE SODIUM 8.6-50 MG PO TABS
2.0000 | ORAL_TABLET | Freq: Every day | ORAL | Status: DC
  Administered 2012-07-09 – 2012-07-10 (×2): 2 via ORAL

## 2012-07-09 MED ORDER — IBUPROFEN 600 MG PO TABS
600.0000 mg | ORAL_TABLET | Freq: Four times a day (QID) | ORAL | Status: DC
  Administered 2012-07-09 – 2012-07-11 (×7): 600 mg via ORAL
  Filled 2012-07-09 (×4): qty 1

## 2012-07-09 MED ORDER — TERBUTALINE SULFATE 1 MG/ML IJ SOLN: 0.2500 mg | Freq: Once | INTRAMUSCULAR | Status: AC | PRN

## 2012-07-09 MED ORDER — OXYTOCIN 40 UNITS IN LACTATED RINGERS INFUSION - SIMPLE MED: 62.5000 mL/h | INTRAVENOUS | Status: DC

## 2012-07-09 MED ORDER — LACTATED RINGERS IV SOLN
INTRAVENOUS | Status: DC
  Administered 2012-07-09: 07:00:00 via INTRAVENOUS
  Administered 2012-07-09: 950 mL via INTRAVENOUS
  Administered 2012-07-09: 1000 mL via INTRAVENOUS

## 2012-07-09 MED ORDER — EPHEDRINE 5 MG/ML INJ
10.0000 mg | INTRAVENOUS | Status: DC | PRN
  Filled 2012-07-09: qty 2

## 2012-07-09 MED ORDER — BUTORPHANOL TARTRATE 1 MG/ML IJ SOLN: 1.0000 mg | INTRAMUSCULAR | Status: DC | PRN

## 2012-07-09 MED ORDER — OXYCODONE-ACETAMINOPHEN 5-325 MG PO TABS
1.0000 | ORAL_TABLET | ORAL | Status: DC | PRN
  Administered 2012-07-09: 1 via ORAL
  Filled 2012-07-09 (×2): qty 1
  Filled 2012-07-09: qty 2
  Filled 2012-07-09: qty 1

## 2012-07-09 MED ORDER — PANTOPRAZOLE SODIUM 40 MG PO TBEC
40.0000 mg | DELAYED_RELEASE_TABLET | Freq: Every day | ORAL | Status: DC
  Administered 2012-07-09 – 2012-07-11 (×3): 40 mg via ORAL
  Filled 2012-07-09 (×3): qty 1

## 2012-07-09 MED ORDER — CITRIC ACID-SODIUM CITRATE 334-500 MG/5ML PO SOLN: 30.0000 mL | ORAL | Status: DC | PRN

## 2012-07-09 MED ORDER — PHENYLEPHRINE 40 MCG/ML (10ML) SYRINGE FOR IV PUSH (FOR BLOOD PRESSURE SUPPORT)
80.0000 ug | PREFILLED_SYRINGE | INTRAVENOUS | Status: DC | PRN
  Filled 2012-07-09: qty 2

## 2012-07-09 MED ORDER — EPHEDRINE 5 MG/ML INJ
10.0000 mg | INTRAVENOUS | Status: DC | PRN
  Filled 2012-07-09: qty 2
  Filled 2012-07-09: qty 4

## 2012-07-09 MED ORDER — LIDOCAINE HCL (PF) 1 % IJ SOLN
30.0000 mL | INTRAMUSCULAR | Status: DC | PRN
  Filled 2012-07-09: qty 30

## 2012-07-09 MED ORDER — LACTATED RINGERS IV SOLN: 500.0000 mL | Freq: Once | INTRAVENOUS | Status: DC

## 2012-07-09 MED ORDER — LIDOCAINE HCL (PF) 1 % IJ SOLN
INTRAMUSCULAR | Status: DC | PRN
  Administered 2012-07-09 (×2): 5 mL

## 2012-07-09 MED ORDER — ACETAMINOPHEN 325 MG PO TABS: 650.0000 mg | ORAL_TABLET | ORAL | Status: DC | PRN

## 2012-07-09 MED ORDER — OXYTOCIN 40 UNITS IN LACTATED RINGERS INFUSION - SIMPLE MED
1.0000 m[IU]/min | INTRAVENOUS | Status: DC
  Administered 2012-07-09: 4 m[IU]/min via INTRAVENOUS
  Administered 2012-07-09: 6 m[IU]/min via INTRAVENOUS
  Administered 2012-07-09: 2 m[IU]/min via INTRAVENOUS
  Filled 2012-07-09: qty 1000

## 2012-07-09 MED ORDER — ONDANSETRON HCL 4 MG/2ML IJ SOLN: 4.0000 mg | INTRAMUSCULAR | Status: DC | PRN

## 2012-07-09 MED ORDER — ZOLPIDEM TARTRATE 5 MG PO TABS: 5.0000 mg | ORAL_TABLET | Freq: Every evening | ORAL | Status: DC | PRN

## 2012-07-09 MED ORDER — LACTATED RINGERS IV SOLN: INTRAVENOUS | Status: DC

## 2012-07-09 MED ORDER — ONDANSETRON HCL 4 MG PO TABS: 4.0000 mg | ORAL_TABLET | ORAL | Status: DC | PRN

## 2012-07-09 MED ORDER — OXYTOCIN BOLUS FROM INFUSION: 500.0000 mL | INTRAVENOUS | Status: DC

## 2012-07-09 NOTE — Anesthesia Postprocedure Evaluation (Signed)
Anesthesia Post Note  Patient: Kristina Bauer  Procedure(s) Performed: * No procedures listed *  Anesthesia type: Epidural  Patient location: Mother/Baby  Post pain: Pain level controlled  Post assessment: Post-op Vital signs reviewed  Last Vitals:  Filed Vitals:   07/09/12 1700  BP: 103/71  Pulse: 93  Temp: 36.2 C  Resp: 20    Post vital signs: Reviewed  Level of consciousness:alert  Complications: No apparent anesthesia complications

## 2012-07-09 NOTE — Anesthesia Procedure Notes (Signed)
Epidural Patient location during procedure: OB Start time: 07/09/2012 9:40 AM  Staffing Anesthesiologist: Angus Seller., Harrell Gave. Performed by: anesthesiologist   Preanesthetic Checklist Completed: patient identified, site marked, surgical consent, pre-op evaluation, timeout performed, IV checked, risks and benefits discussed and monitors and equipment checked  Epidural Patient position: sitting Prep: site prepped and draped and DuraPrep Patient monitoring: continuous pulse ox and blood pressure Approach: midline Injection technique: LOR air and LOR saline  Needle:  Needle type: Tuohy  Needle gauge: 17 G Needle length: 9 cm and 9 Needle insertion depth: 7 cm Catheter type: closed end flexible Catheter size: 19 Gauge Catheter at skin depth: 14 cm Test dose: negative  Assessment Events: blood not aspirated, injection not painful, no injection resistance, negative IV test and no paresthesia  Additional Notes Patient identified.  Risk benefits discussed including failed block, incomplete pain control, headache, nerve damage, paralysis, blood pressure changes, nausea, vomiting, reactions to medication both toxic or allergic, and postpartum back pain.  Patient expressed understanding and wished to proceed.  All questions were answered.  Sterile technique used throughout procedure and epidural site dressed with sterile barrier dressing. No paresthesia or other complications noted.The patient did not experience any signs of intravascular injection such as tinnitus or metallic taste in mouth nor signs of intrathecal spread such as rapid motor block. Please see nursing notes for vital signs.

## 2012-07-09 NOTE — Anesthesia Preprocedure Evaluation (Signed)
Anesthesia Evaluation  Patient identified by MRN, date of birth, ID band Patient awake    Reviewed: Allergy & Precautions, H&P , Patient's Chart, lab work & pertinent test results  Airway Mallampati: III TM Distance: >3 FB Neck ROM: full    Dental no notable dental hx.    Pulmonary neg pulmonary ROS, asthma ,  breath sounds clear to auscultation  Pulmonary exam normal       Cardiovascular negative cardio ROS  Rhythm:regular Rate:Normal     Neuro/Psych  Headaches, PSYCHIATRIC DISORDERS Anxiety Depression  Neuromuscular disease negative neurological ROS  negative psych ROS   GI/Hepatic negative GI ROS, Neg liver ROS, GERD-  ,  Endo/Other  negative endocrine ROSMorbid obesity  Renal/GU negative Renal ROS     Musculoskeletal  (+) Fibromyalgia -  Abdominal   Peds  Hematology negative hematology ROS (+)   Anesthesia Other Findings GERD (gastroesophageal reflux disease)     Obesity        Anxiety     Depression        Hx of pre-eclampsia in prior pregnancy, currently pregnant     Fibromyalgia        IBS (irritable bowel syndrome)     Abnormal Pap smear        ADHD (attention deficit hyperactivity disorder)     Asthma        Headache     Herniated disc, cervical        Normal pregnancy, repeat 07/08/2012               Reproductive/Obstetrics (+) Pregnancy                           Anesthesia Physical Anesthesia Plan  ASA: III  Anesthesia Plan: Epidural   Post-op Pain Management:    Induction:   Airway Management Planned:   Additional Equipment:   Intra-op Plan:   Post-operative Plan:   Informed Consent: I have reviewed the patients History and Physical, chart, labs and discussed the procedure including the risks, benefits and alternatives for the proposed anesthesia with the patient or authorized representative who has indicated his/her understanding and acceptance.     Plan  Discussed with:   Anesthesia Plan Comments:         Anesthesia Quick Evaluation

## 2012-07-09 NOTE — Progress Notes (Signed)
UR chart review completed.  

## 2012-07-09 NOTE — Progress Notes (Signed)
Patient ID: Kristina Bauer, female   DOB: Sep 10, 1980, 32 y.o.   MRN: [id number]  No c/o's  AFVSS  gen NAD  FHTs 140's good var toco 2-3min  SVE 3/50/-2 AROM for clear fluid  Expect SVD Augment with pitocin Epidural prn

## 2012-07-10 LAB — CBC
MCHC: 33 g/dL (ref 30.0–36.0)
Platelets: 364 10*3/uL (ref 150–400)
RDW: 14.3 % (ref 11.5–15.5)
WBC: 13 10*3/uL — ABNORMAL HIGH (ref 4.0–10.5)

## 2012-07-10 NOTE — Progress Notes (Signed)
Post Partum Day 1 Subjective: no complaints, up ad lib, tolerating PO and nl lochia, pain controlled  Objective: Blood pressure 143/85, pulse 99, temperature 97.9 F (36.6 C), temperature source Oral, resp. rate 20, height 5\' 4"  (1.626 m), weight 122.471 kg (270 lb), last menstrual period 10/06/2011, SpO2 96.00%, unknown if currently breastfeeding.  Physical Exam:  General: alert and no distress Lochia: appropriate Uterine Fundus: firm   Recent Labs  07/09/12 0743 07/10/12 0630  HGB 11.1* 9.6*  HCT 33.4* 29.1*    Assessment/Plan: Plan for discharge tomorrow, Breastfeeding and Lactation consult.  Routine care.     LOS: 1 day   BOVARD,Chattie Greeson 07/10/2012, 7:40 AM

## 2012-07-10 NOTE — Clinical Social Work Note (Signed)
CSW spoke with MOB about hx of anxiety/depression. MOB reports having some depression issues around something situational 1 year ago where she was prescribed Cymbalta. MOB reports not using medication for a long period and has no symptom concerns since then.   Patient was referred for history of depression/anxiety.  * Referral screened out by Clinical Social Worker because none of the following criteria appear to apply:  ~ History of anxiety/depression during this pregnancy, or of post-partum depression.  ~ Diagnosis of anxiety and/or depression within last 3 years   ~ History of depression due to pregnancy loss/loss of child  OR   * Patient's symptoms currently being treated with medication and/or therapy.   Please contact the Clinical Social Worker if needs arise, or by the patient's request.  

## 2012-07-11 MED ORDER — IBUPROFEN 800 MG PO TABS: 800.0000 mg | ORAL_TABLET | Freq: Three times a day (TID) | ORAL | Status: DC | PRN

## 2012-07-11 MED ORDER — PRENATAL MULTIVITAMIN CH: 1.0000 | ORAL_TABLET | Freq: Every day | ORAL | Status: DC

## 2012-07-11 MED ORDER — OXYCODONE-ACETAMINOPHEN 5-325 MG PO TABS: 1.0000 | ORAL_TABLET | Freq: Four times a day (QID) | ORAL | Status: DC | PRN

## 2012-07-11 NOTE — Discharge Summary (Signed)
Obstetric Discharge Summary Reason for Admission: induction of labor Prenatal Procedures: none Intrapartum Procedures: spontaneous vaginal delivery Postpartum Procedures: none Complications-Operative and Postpartum: 2nd degree perineal laceration Hemoglobin  Date Value Range Status  07/10/2012 9.6* 12.0 - 15.0 g/dL Final     HCT  Date Value Range Status  07/10/2012 29.1* 36.0 - 46.0 % Final    Physical Exam:  General: alert and no distress Lochia: appropriate Uterine Fundus: firm  Discharge Diagnoses: Term Pregnancy-delivered  Discharge Information: Date: 07/11/2012 Activity: pelvic rest Diet: routine Medications: PNV, Ibuprofen and Percocet Condition: stable Instructions: refer to practice specific booklet Discharge to: home Follow-up Information   Follow up with Kristina Bauer,Kristina Ludemann, MD. Schedule an appointment as soon as possible for a visit in 6 weeks.   Contact information:   [address] [place] [address] [phone]       Newborn Data: Live born female  Birth Weight: 7 lb 9 oz (3430 g) APGAR: 9, 9  Home with mother.  Kristina Bauer,Kristina Bauer 07/11/2012, 9:13 AM

## 2012-07-11 NOTE — Progress Notes (Signed)
Post Partum Day 2 Subjective: no complaints, up ad lib, voiding, tolerating PO and nl lochia, pain controlled  Objective: Blood pressure 106/71, pulse 84, temperature 98.3 F (36.8 C), temperature source Oral, resp. rate 18, height 5\' 4"  (1.626 m), weight 122.471 kg (270 lb), last menstrual period 10/06/2011, SpO2 96.00%, unknown if currently breastfeeding.  Physical Exam:  General: alert and no distress Lochia: appropriate Uterine Fundus: firm   Recent Labs  07/09/12 0743 07/10/12 0630  HGB 11.1* 9.6*  HCT 33.4* 29.1*    Assessment/Plan: Discharge home, Breastfeeding and Lactation consult.  D/c motrin, percocet, pnv.  F/u 6 weeks.     LOS: 2 days   BOVARD,Zarianna Dicarlo 07/11/2012, 8:52 AM

## 2012-07-12 ENCOUNTER — Inpatient Hospital Stay (HOSPITAL_COMMUNITY): Admission: AD | Admit: 2012-07-12 | Payer: Self-pay | Source: Ambulatory Visit | Admitting: Obstetrics and Gynecology

## 2012-07-14 ENCOUNTER — Encounter: Payer: Self-pay | Admitting: Neurology

## 2012-07-19 DIAGNOSIS — R51 Headache: Secondary | ICD-10-CM

## 2012-07-20 ENCOUNTER — Ambulatory Visit (INDEPENDENT_AMBULATORY_CARE_PROVIDER_SITE_OTHER): Payer: Medicaid Other | Admitting: Neurology

## 2012-07-20 ENCOUNTER — Encounter: Payer: Self-pay | Admitting: Neurology

## 2012-07-20 VITALS — BP 138/81 | HR 82 | Temp 99.0°F | Ht 64.0 in | Wt 254.0 lb

## 2012-07-20 DIAGNOSIS — G43009 Migraine without aura, not intractable, without status migrainosus: Secondary | ICD-10-CM

## 2012-07-20 NOTE — Patient Instructions (Signed)
She was advised to increased Tylenol # to 2 tablets as needed for headaches. The headache frequency at present is not enough to justify prophylactic medications. I advised her to ensure adequate sleep and participate in stress relaxation activities if possible. Return for followup in 3 months with Larita Fife, nurse practitioner

## 2012-07-20 NOTE — Progress Notes (Signed)
[facility] [address] [place]. [place] [address] [phone]       OFFICE FOLLOW-UP NOTE  Ms. Kristina Bauer Date of Birth:  [birth year] Medical Record Number:  [id number]   HPI: 32 year old Caucasian lady with long-standing history of migraines with recent pregnancy.  She returns for followup of her last visit with me on 03/03/12. She delivered a healthy baby girl a few days ago. She states her migraines remain pretty stable in the last trimester of pregnancy and she did not have any particular increase. She did have a migraine last week but she blames this on lack of sleep due to her newborn. She does take Tylenol with codeine but does not find a dramatic benefit but takes only 1 tablet. She underwent MRI scan of the brain on 07/19/12 which I have personally reviewed and was normal. MRA of the brain showed no intracranial aneurysms or large vessel stenosis. ROS:   14 system review of systems is positive for  itching, diarrhea, constipation, loss of vision, headache, slurred speech, lack of sleep, insomnia, allergies and skin sensitivity. PMH:  Past Medical History  Diagnosis Date  . GERD (gastroesophageal reflux disease)   . Obesity   . Anxiety   . Depression   . Hx of pre-eclampsia in prior pregnancy, currently pregnant   . Fibromyalgia   . IBS (irritable bowel syndrome)   . Abnormal Pap smear   . ADHD (attention deficit hyperactivity disorder)   . Asthma   . Headache   . Herniated disc, cervical   . Normal pregnancy, repeat 07/08/2012  . SVD (spontaneous vaginal delivery) 07/09/2012    Social History:  History   Social History  . Marital Status: Single    Spouse Name: N/A    Number of Children: 2  . Years of Education: college   Occupational History  . Not on file.   Social History Main Topics  . Smoking status: Former Smoker    Quit date: 01/09/2008  . Smokeless tobacco: Never Used  . Alcohol Use: No     Comment: not during pregnancy  . Drug  Use: No  . Sexually Active: Yes   Other Topics Concern  . Not on file   Social History Narrative   Pt lives at home with her family.             Medications:   Current Outpatient Prescriptions on File Prior to Visit  Medication Sig Dispense Refill  . acetaminophen-codeine (TYLENOL #3) 300-30 MG per tablet Take 1 tablet by mouth 3 (three) times daily as needed (For migraines.).      Marland Kitchen cyclobenzaprine (FLEXERIL) 5 MG tablet Take 5 mg by mouth 3 (three) times daily as needed for muscle spasms.      . diphenhydrAMINE (BENADRYL) 25 mg capsule Take 25 mg by mouth daily as needed for allergies.      Marland Kitchen ibuprofen (ADVIL,MOTRIN) 800 MG tablet Take 1 tablet (800 mg total) by mouth every 8 (eight) hours as needed for pain.  45 tablet  1  . pantoprazole (PROTONIX) 40 MG tablet Take 40 mg by mouth daily.      . Prenatal Vit-Fe Fumarate-FA (PRENATAL MULTIVITAMIN) TABS Take 1 tablet by mouth daily at 12 noon.  30 tablet  12   No current facility-administered medications on file prior to visit.    Allergies:   Allergies  Allergen Reactions  . Shellfish Allergy Other (See Comments)    Tested positive for this allergy during screening, has  never been exposed so reaction is unknown.  . Latex Rash    Physical Exam General: well developed, well nourished young Caucasian lady, seated, in no evident distress Head: head normocephalic and atraumatic. Orohparynx benign Neck: supple with no carotid or supraclavicular bruits Cardiovascular: regular rate and rhythm, no murmurs Musculoskeletal: no deformity Skin:  no rash/petichiae Vascular:  Normal pulses all extremities  Neurologic Exam Mental Status: Awake and fully alert. Oriented to place and time. Recent and remote memory intact. Attention span, concentration and fund of knowledge appropriate. Mood and affect appropriate.  Cranial Nerves: Fundoscopic exam reveals sharp disc margins. Pupils equal, briskly reactive to light. Extraocular movements  full without nystagmus. Visual fields full to confrontation. Hearing intact. Facial sensation intact. Face, tongue, palate moves normally and symmetrically.  Motor: Normal bulk and tone. Normal strength in all tested extremity muscles. Sensory.: intact to tough and pinprick and vibratory.  Coordination: Rapid alternating movements normal in all extremities. Finger-to-nose and heel-to-shin performed accurately bilaterally. Gait and Station: Arises from chair without difficulty. Stance is normal. Gait demonstrates normal stride length and balance . Able to heel, toe and tandem walk without difficulty.  Reflexes: 1+ and symmetric. Toes downgoing.     ASSESSMENT:  29 year lady with long-standing history of migraine headaches which have remained fairly stable during her recent pregnancy.   PLAN: Continue Tylenol with Codeine for symptomatically for her headaches but take 2 tablets instead of one. The headache frequency is not enough to justify prophylactic medications at the current time. I have advised her to avoid headache triggers like stress and sleep deprivation. Participate in activities for stress relaxation activities like medication, exercise, yoga. Maintain a strict headache diary and return for follow in 3 months with nurse practitioner

## 2012-07-25 ENCOUNTER — Other Ambulatory Visit: Payer: Self-pay | Admitting: Neurology

## 2012-07-25 DIAGNOSIS — R51 Headache: Secondary | ICD-10-CM

## 2012-07-27 ENCOUNTER — Telehealth: Payer: Self-pay | Admitting: *Deleted

## 2012-07-27 NOTE — Telephone Encounter (Signed)
Called patient to let her know her results where normal.lvm asking to call me back.

## 2012-08-02 ENCOUNTER — Encounter: Payer: Self-pay | Admitting: Neurology

## 2012-08-03 ENCOUNTER — Ambulatory Visit: Payer: Self-pay | Admitting: Neurology

## 2012-08-20 ENCOUNTER — Other Ambulatory Visit: Payer: Self-pay | Admitting: Neurology

## 2012-08-20 MED ORDER — ACETAMINOPHEN-CODEINE #3 300-30 MG PO TABS: 1.0000 | ORAL_TABLET | Freq: Three times a day (TID) | ORAL | Status: DC | PRN

## 2012-08-25 ENCOUNTER — Telehealth: Payer: Self-pay | Admitting: Neurology

## 2012-08-25 NOTE — Telephone Encounter (Signed)
Patient is calling to tell us her pharmacy of choice ([facility] on [address] in [place]) has not received the prescription for her Tylenol with codeine.  She tells me her pharmacy says they have faxed the prescription information on May 21, 23 and 26th.

## 2012-08-25 NOTE — Telephone Encounter (Signed)
I called and spoke with patient regarding Rx which was processed on 5/23 (Please see chart).  She is aware.

## 2012-10-19 ENCOUNTER — Ambulatory Visit: Payer: Medicaid Other | Admitting: Nurse Practitioner

## 2014-01-30 ENCOUNTER — Encounter: Payer: Self-pay | Admitting: Neurology

## 2014-02-10 ENCOUNTER — Ambulatory Visit (HOSPITAL_BASED_OUTPATIENT_CLINIC_OR_DEPARTMENT_OTHER): Payer: Medicaid Other | Attending: Internal Medicine

## 2014-02-10 VITALS — Ht 64.0 in | Wt 280.0 lb

## 2014-02-10 DIAGNOSIS — Z6841 Body Mass Index (BMI) 40.0 and over, adult: Secondary | ICD-10-CM | POA: Diagnosis not present

## 2014-02-10 DIAGNOSIS — G4730 Sleep apnea, unspecified: Secondary | ICD-10-CM | POA: Diagnosis not present

## 2014-02-10 DIAGNOSIS — G4710 Hypersomnia, unspecified: Secondary | ICD-10-CM | POA: Insufficient documentation

## 2014-02-10 DIAGNOSIS — G4733 Obstructive sleep apnea (adult) (pediatric): Secondary | ICD-10-CM

## 2014-02-18 DIAGNOSIS — G4733 Obstructive sleep apnea (adult) (pediatric): Secondary | ICD-10-CM | POA: Diagnosis not present

## 2014-02-18 NOTE — Sleep Study (Signed)
   NAME: Kristina Bauer DATE OF BIRTH:  10/05/1980 MEDICAL RECORD NUMBER [id number]  LOCATION: [facility]  PHYSICIAN: Dresean Beckel D  DATE OF STUDY: 02/10/2014  SLEEP STUDY TYPE: Nocturnal Polysomnogram               REFERRING PHYSICIAN: Darnelle Bossborne, James Charles,*  INDICATION FOR STUDY: Hypersomnia with sleep apnea  EPWORTH SLEEPINESS SCORE:   17/24 HEIGHT: 5\' 4"  (162.6 cm)  WEIGHT: 280 lb (127.007 kg)    Body mass index is 48.04 kg/(m^2).  NECK SIZE: 14.5 in.  MEDICATIONS: Charted for review  SLEEP ARCHITECTURE: Total sleep time 203 minutes with sleep efficiency 52.9%. Stage I was 15.5%, stage II 73.6%, stage III 10.8%, REM absent. Sleep latency 108 minutes, awake after sleep onset 44.5 minutes, arousal index 18, bedtime medication: None. Difficulty initiating sleep with sustained sleep onset around 12:30 AM and spontaneously awake at 4 AM  RESPIRATORY DATA: Apnea hypopnea index (AHI) 31.9 per hour. 100 total events scored, all as hypopneas, more frequently while nonsupine area. The study was ordered as a diagnostic polysomnogram without CPAP.  OXYGEN DATA: Moderate snoring with oxygen desaturation to a nadir of 86% and mean saturation 94.8% on room air.  CARDIAC DATA: Normal sinus rhythm  MOVEMENT/PARASOMNIA: A few incidental limb jerks were noted with little effect on sleep. No bathroom trips.  IMPRESSION/ RECOMMENDATION:   1) Difficulty initiating and maintaining sleep with sustained sleep onset around 12:30 AM and spontaneously awake at 4 AM. No bedtime medications taken. 2) Severe obstructive sleep apnea/hypopnea syndrome, AHI 31.9 per hour. All events were hypopneas, more common while nonsupine. Moderate snoring with oxygen desaturation to a nadir of 86% and mean saturation 94.8% on room air. This study was ordered as a diagnostic polysomnogram without CPAP.   Waymon BudgeYOUNG,Crews Mccollam D Diplomate, American Board of Sleep Medicine  ELECTRONICALLY SIGNED ON:   02/18/2014, 10:05 AM [facility] PH: [phone]   FX: [phone] ACCREDITED BY THE AMERICAN ACADEMY OF SLEEP MEDICINE

## 2014-09-10 ENCOUNTER — Encounter (HOSPITAL_BASED_OUTPATIENT_CLINIC_OR_DEPARTMENT_OTHER): Payer: Medicaid Other

## 2014-09-11 ENCOUNTER — Ambulatory Visit (HOSPITAL_BASED_OUTPATIENT_CLINIC_OR_DEPARTMENT_OTHER): Payer: Medicaid Other

## 2014-10-08 ENCOUNTER — Ambulatory Visit (HOSPITAL_BASED_OUTPATIENT_CLINIC_OR_DEPARTMENT_OTHER): Payer: Medicaid Other | Attending: Internal Medicine

## 2014-10-08 VITALS — Ht 64.0 in | Wt 280.0 lb

## 2014-10-08 DIAGNOSIS — G4733 Obstructive sleep apnea (adult) (pediatric): Secondary | ICD-10-CM | POA: Diagnosis not present

## 2014-10-08 DIAGNOSIS — R0683 Snoring: Secondary | ICD-10-CM | POA: Diagnosis not present

## 2014-10-08 DIAGNOSIS — G4730 Sleep apnea, unspecified: Secondary | ICD-10-CM

## 2014-10-09 ENCOUNTER — Ambulatory Visit (HOSPITAL_BASED_OUTPATIENT_CLINIC_OR_DEPARTMENT_OTHER): Payer: Medicaid Other | Attending: Internal Medicine | Admitting: Radiology

## 2014-10-09 VITALS — Ht 64.0 in | Wt 280.0 lb

## 2014-10-09 DIAGNOSIS — G4719 Other hypersomnia: Secondary | ICD-10-CM | POA: Diagnosis present

## 2014-10-09 DIAGNOSIS — G4733 Obstructive sleep apnea (adult) (pediatric): Secondary | ICD-10-CM | POA: Insufficient documentation

## 2014-10-09 DIAGNOSIS — F32A Depression, unspecified: Secondary | ICD-10-CM

## 2014-10-09 DIAGNOSIS — R5383 Other fatigue: Secondary | ICD-10-CM

## 2014-10-09 DIAGNOSIS — G4710 Hypersomnia, unspecified: Secondary | ICD-10-CM | POA: Insufficient documentation

## 2014-10-09 DIAGNOSIS — R51 Headache: Secondary | ICD-10-CM | POA: Insufficient documentation

## 2014-10-09 DIAGNOSIS — F329 Major depressive disorder, single episode, unspecified: Secondary | ICD-10-CM

## 2014-10-14 ENCOUNTER — Ambulatory Visit (HOSPITAL_BASED_OUTPATIENT_CLINIC_OR_DEPARTMENT_OTHER): Payer: Medicaid Other | Admitting: Internal Medicine

## 2014-10-14 DIAGNOSIS — F329 Major depressive disorder, single episode, unspecified: Secondary | ICD-10-CM | POA: Diagnosis not present

## 2014-10-14 DIAGNOSIS — G4719 Other hypersomnia: Secondary | ICD-10-CM

## 2014-10-14 NOTE — Progress Notes (Signed)
    Patient Name: Kristina Bauer, [name] Date: 10/09/2014 Gender: Female D.O.B: 1980/04/30 Age (years): 34 Referring Provider: Dr Theressa MillardJames Osborne Height (inches): 64 Interpreting Physician: Jetty Duhamellinton Young MD, ABSM Weight (lbs): 280 RPSGT: [employer] [place], [place] BMI: 48 MRN: [id number] Neck Size: 16.00 CLINICAL INFORMATION Sleep Study Type: MSLT  The patient was referred to the sleep center for evaluation of daytime sleepiness.  Epworth Sleepiness Score: 15  SLEEP STUDY TECHNIQUE A Multiple Sleep Latency Test was performed after an overnight polysomnogram according to the AASM scoring manual v2.3 (April 2016) and clinical guidelines. Five nap opportunities occurred over the course of the test which followed an overnight polysomnogram. The channels recorded and monitored were frontal, central, and occipital electroencephalography (EEG), right and left electrooculogram (EOG), chin electromyography (EMG), and electrocardiogram (EKG).  MEDICATIONS Medications taken by the patient : none per protocol Medications administered by patient during sleep study : No sleep medicine administered.  IMPRESSIONS Total number of naps attempted: 5.00 . Total number of naps with sleep attained: 5.00. The Mean Sleep Latency was 00:04:02 minutes. There were 0.0 naps with sleep-onset REM periods. The patient appears to have pathologic sleepiness, evidenced by a short mean sleep latency (8 minutes or less) on this MSLT. No sleep onset REMs were noted during this MSLT. Baseline NPSG without her usual CPAP 11 the night before had recorded moderate OSA, AHI 18.2/ hr,, Total Sleep Time 240.5 minutes, 16% REM.  The patient woke with headache after her first 2 naps, done without CPAP. The technician called Dr Earl Galasborne who asked subsequent naps be done with CPAP 11. This was subsequently increased to 13 cwp for naps 4 and 5. She still reported headache on awakening from each nap. She took Aleve before nap  3.  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

## 2014-10-14 NOTE — Progress Notes (Signed)
   Patient Name: Kristina Bauer, [name] Date: 10/08/2014 Gender: Female D.O.B: 1980-05-27 Age (years): [age] Referring Provider: Not Available Height (inches): 64 Interpreting Physician: Jetty Duhamellinton Aeris Hersman MD, ABSM Weight (lbs): 280 RPSGT: Cherylann ParrDubili, Fred BMI: 48 MRN: [id number] Neck Size: 16.00 CLINICAL INFORMATION Sleep Study Type: NPSG  Indication for sleep study: Obstructive sleep apnea  Epworth Sleepiness Score: 15  SLEEP STUDY TECHNIQUE As per the AASM Manual for the Scoring of Sleep and Associated Events v2.3 (April 2016) with a hypopnea requiring 4% desaturations.  The channels recorded and monitored were frontal, central and occipital EEG, electrooculogram (EOG), submentalis EMG (chin), nasal and oral airflow, thoracic and abdominal wall motion, anterior tibialis EMG, snore microphone, electrocardiogram, and pulse oximetry.  MEDICATIONS Patient's medications include: Charted for review. Medications self-administered by patient during sleep study : No sleep medicine administered.  SLEEP ARCHITECTURE The study was initiated at 10:07:47 PM and ended at 6:02:34 AM.  Sleep onset time was 63.5 minutes and the sleep efficiency was 50.7%. The total sleep time was 240.5 minutes.  Stage REM latency was 147.0 minutes.  The patient spent 24.95% of the night in stage N1 sleep, 58.63% in stage N2 sleep, 0.00% in stage N3 and 16.42% in REM.  Alpha intrusion was absent.  Supine sleep was 33.47%.  Awake after sleep onset 5.5 minutes  RESPIRATORY PARAMETERS The overall apnea/hypopnea index (AHI) was 18.2 per hour. There were 2 total apneas, including 2 obstructive, 0 central and 0 mixed apneas. There were 71 hypopneas and 112 RERAs.  The AHI during Stage REM sleep was 6.1 per hour.  AHI while supine was 15.7 per hour.  The mean oxygen saturation was 95.54%. The minimum SpO2 during sleep was 91.00%.  Moderate snoring was noted during this study.  This was a diagnostic NPSG as  ordered, without CPAP  CARDIAC DATA The 2 lead EKG demonstrated sinus rhythm. The mean heart rate was 62.58 beats per minute. Other EKG findings include: None.  LEG MOVEMENT DATA The total PLMS were 0 with a resulting PLMS index of 0.00. Associated arousal with leg movement index was 0.0 .  IMPRESSIONS Moderate obstructive sleep apnea occurred during this study (AHI = 18.2/h). No significant central sleep apnea occurred during this study (CAI = 0.0/h). The patient had minimal or no oxygen desaturation during the study (Min O2 = 91.00%) The patient snored with Moderate snoring volume. No cardiac abnormalities were noted during this study. Clinically significant periodic limb movements did not occur during sleep. No significant associated arousals.  DIAGNOSIS Obstructive Sleep Apnea (327.23 [G47.33 ICD-10])  RECOMMENDATIONS  This patient uses CPAP at home. See result of MSLT done following this study. Avoid alcohol, sedatives and other CNS depressants that may worsen sleep apnea and disrupt normal sleep architecture. Sleep hygiene should be reviewed to assess factors that may improve sleep quality. Weight management and regular exercise should be initiated or continued if appropriate.  Waymon BudgeYOUNG,Vonetta Foulk D Diplomate, American Board of Sleep Medicine  ELECTRONICALLY SIGNED ON:  10/14/2014, 11:10 AM  [facility] PH: [phone]   FX: [phone] ACCREDITED BY THE AMERICAN ACADEMY OF SLEEP MEDICINE

## 2014-10-15 ENCOUNTER — Ambulatory Visit (HOSPITAL_BASED_OUTPATIENT_CLINIC_OR_DEPARTMENT_OTHER): Payer: Medicaid Other | Admitting: Internal Medicine

## 2014-10-15 DIAGNOSIS — G4730 Sleep apnea, unspecified: Secondary | ICD-10-CM

## 2016-08-09 ENCOUNTER — Emergency Department (HOSPITAL_COMMUNITY): Payer: Medicaid Other

## 2016-08-09 ENCOUNTER — Emergency Department (HOSPITAL_COMMUNITY)
Admission: EM | Admit: 2016-08-09 | Discharge: 2016-08-09 | Disposition: A | Discharge status: Home / Self Care | Payer: Medicaid Other | Attending: Emergency Medicine | Admitting: Emergency Medicine

## 2016-08-09 ENCOUNTER — Encounter (HOSPITAL_COMMUNITY): Payer: Self-pay | Admitting: Emergency Medicine

## 2016-08-09 DIAGNOSIS — J45909 Unspecified asthma, uncomplicated: Secondary | ICD-10-CM | POA: Insufficient documentation

## 2016-08-09 DIAGNOSIS — F909 Attention-deficit hyperactivity disorder, unspecified type: Secondary | ICD-10-CM | POA: Diagnosis not present

## 2016-08-09 DIAGNOSIS — R1031 Right lower quadrant pain: Secondary | ICD-10-CM | POA: Insufficient documentation

## 2016-08-09 DIAGNOSIS — Z91040 Latex allergy status: Secondary | ICD-10-CM | POA: Insufficient documentation

## 2016-08-09 DIAGNOSIS — Z87891 Personal history of nicotine dependence: Secondary | ICD-10-CM | POA: Insufficient documentation

## 2016-08-09 LAB — COMPREHENSIVE METABOLIC PANEL
ALT: 16 U/L (ref 14–54)
ANION GAP: 8 (ref 5–15)
AST: 18 U/L (ref 15–41)
Albumin: 4 g/dL (ref 3.5–5.0)
Alkaline Phosphatase: 69 U/L (ref 38–126)
BUN: 8 mg/dL (ref 6–20)
CHLORIDE: 106 mmol/L (ref 101–111)
CO2: 23 mmol/L (ref 22–32)
Calcium: 8.9 mg/dL (ref 8.9–10.3)
Creatinine, Ser: 0.67 mg/dL (ref 0.44–1.00)
GFR calc non Af Amer: >60 mL/min (ref >60)
Glucose, Bld: 108 mg/dL — ABNORMAL HIGH (ref 65–99)
POTASSIUM: 3.9 mmol/L (ref 3.5–5.1)
SODIUM: 137 mmol/L (ref 135–145)
Total Bilirubin: 0.5 mg/dL (ref 0.3–1.2)
Total Protein: 7.4 g/dL (ref 6.5–8.1)

## 2016-08-09 LAB — URINALYSIS, ROUTINE W REFLEX MICROSCOPIC
Bilirubin Urine: NEGATIVE
GLUCOSE, UA: NEGATIVE mg/dL
Ketones, ur: 5 mg/dL — AB
NITRITE: NEGATIVE
PH: 5 (ref 5.0–8.0)
Protein, ur: NEGATIVE mg/dL
SPECIFIC GRAVITY, URINE: 1.026 (ref 1.005–1.030)

## 2016-08-09 LAB — CBC WITH DIFFERENTIAL/PLATELET
Basophils Absolute: 0 10*3/uL (ref 0.0–0.1)
Basophils Relative: 0 %
Eosinophils Absolute: 0.1 10*3/uL (ref 0.0–0.7)
Eosinophils Relative: 1 %
HCT: 38 % (ref 36.0–46.0)
Hemoglobin: 12.3 g/dL (ref 12.0–15.0)
LYMPHS ABS: 2.2 10*3/uL (ref 0.7–4.0)
LYMPHS PCT: 23 %
MCH: 30 pg (ref 26.0–34.0)
MCHC: 32.4 g/dL (ref 30.0–36.0)
MCV: 92.7 fL (ref 78.0–100.0)
MONO ABS: 0.9 10*3/uL (ref 0.1–1.0)
Monocytes Relative: 9 %
NEUTROS ABS: 6.6 10*3/uL (ref 1.7–7.7)
Neutrophils Relative %: 67 %
PLATELETS: 472 10*3/uL — AB (ref 150–400)
RBC: 4.1 MIL/uL (ref 3.87–5.11)
RDW: 13.9 % (ref 11.5–15.5)
WBC: 9.8 10*3/uL (ref 4.0–10.5)

## 2016-08-09 LAB — POC URINE PREG, ED: PREG TEST UR: NEGATIVE

## 2016-08-09 LAB — LIPASE, BLOOD: Lipase: 20 U/L (ref 11–51)

## 2016-08-09 MED ORDER — SODIUM CHLORIDE 0.9 % IV BOLUS (SEPSIS): 1000.0000 mL | Freq: Once | INTRAVENOUS | Status: DC

## 2016-08-09 NOTE — ED Provider Notes (Signed)
[facility]-EMERGENCY DEPT Provider Note   CSN: [id number] Arrival date & time: 08/09/16  0415     History   Chief Complaint Chief Complaint  Patient presents with  . Flank Pain  . Abdominal Pain    HPI Kristina Bauer is a 36 y.o. female.  36 yo F with a chief complaint of right lower quadrant abdominal pain. Sharp stabbing radiates to her right flank. Had some diaphoresis and nausea but denied vomiting. Resolved spontaneously just she arrived to the ED. Last approximately an hour. No similar history of pain. Denies history of kidney stones. Denies prior abdominal surgery.   The history is provided by the patient.  Flank Pain  This is a new problem. The current episode started 1 to 2 hours ago. The problem occurs rarely. The problem has been resolved. Associated symptoms include abdominal pain. Pertinent negatives include no chest pain, no headaches and no shortness of breath. Nothing aggravates the symptoms. Nothing relieves the symptoms. She has tried nothing for the symptoms. The treatment provided no relief.  Abdominal Pain   Associated symptoms include nausea. Pertinent negatives include fever, vomiting, dysuria, headaches, arthralgias and myalgias.    Past Medical History:  Diagnosis Date  . Abnormal Pap smear   . ADHD (attention deficit hyperactivity disorder)   . Anxiety   . Asthma   . Depression   . Fibromyalgia   . GERD (gastroesophageal reflux disease)   . Headache(784.0)   . Herniated disc, cervical   . Hx of pre-eclampsia in prior pregnancy, currently pregnant   . IBS (irritable bowel syndrome)   . Normal pregnancy, repeat 07/08/2012  . Obesity   . SVD (spontaneous vaginal delivery) 07/09/2012    Patient Active Problem List   Diagnosis Date Noted  . SVD (spontaneous vaginal delivery) 07/09/2012  . Normal pregnancy, repeat 07/08/2012    Past Surgical History:  Procedure Laterality Date  . COLPOSCOPY    . wisdom teeth extracted      OB History    Gravida  Para Term Preterm AB Living   3 2 2   1 2    SAB TAB Ectopic Multiple Live Births   1       2       Home Medications    Prior to Admission medications   Medication Sig Start Date End Date Taking? Authorizing Provider  acetaminophen-codeine (TYLENOL #3) 300-30 MG per tablet Take 1 tablet by mouth 3 (three) times daily as needed (For migraines.). 08/20/12   Micki Riley, MD  cyclobenzaprine (FLEXERIL) 5 MG tablet Take 5 mg by mouth 3 (three) times daily as needed for muscle spasms.    [provider]  diphenhydrAMINE (BENADRYL) 25 mg capsule Take 25 mg by mouth daily as needed for allergies.    [provider]  ibuprofen (ADVIL,MOTRIN) 800 MG tablet Take 1 tablet (800 mg total) by mouth every 8 (eight) hours as needed for pain. 07/11/12   Bovard-Stuckert, Augusto Gamble, MD  pantoprazole (PROTONIX) 40 MG tablet Take 40 mg by mouth daily.    [provider]  Prenatal Vit-Fe Fumarate-FA (PRENATAL MULTIVITAMIN) TABS Take 1 tablet by mouth daily at 12 noon. 07/11/12   Bovard-StuckertAugusto Gamble, MD    Family History Family History  Problem Relation Age of Onset  . Anxiety disorder Mother   . Depression Mother   . Hypertension Mother   . Diabetes Father   . Hypertension Father   . Cancer Father        melanoma  .  Fibromyalgia Sister   . Cancer Maternal Grandmother        breast  . Diabetes Maternal Grandmother   . Heart disease Maternal Grandfather   . Cancer Paternal Grandmother        colon  . Diabetes Paternal Grandfather   . Cancer Paternal Grandfather        leukemia    Social History Social History  Substance Use Topics  . Smoking status: Former Smoker    Quit date: 01/09/2008  . Smokeless tobacco: Never Used  . Alcohol use No     Comment: not during pregnancy     Allergies   Shellfish allergy and Latex   Review of Systems Review of Systems  Constitutional: Positive for diaphoresis. Negative for chills and fever.  HENT: Negative for congestion and  rhinorrhea.   Eyes: Negative for redness and visual disturbance.  Respiratory: Negative for shortness of breath and wheezing.   Cardiovascular: Negative for chest pain and palpitations.  Gastrointestinal: Positive for abdominal pain and nausea. Negative for vomiting.  Genitourinary: Positive for flank pain. Negative for dysuria and urgency.  Musculoskeletal: Negative for arthralgias and myalgias.  Skin: Negative for pallor and wound.  Neurological: Negative for dizziness and headaches.     Physical Exam Updated Vital Signs BP 129/64   Pulse 78   Temp 97.7 F (36.5 C) (Oral)   Resp 16   Ht 5\' 4"  (1.626 m)   Wt 265 lb (120.2 kg)   LMP 07/31/2016 (Exact Date)   SpO2 99%   BMI 45.49 kg/m   Physical Exam  Constitutional: She is oriented to person, place, and time. She appears well-developed and well-nourished. No distress.  HENT:  Head: Normocephalic and atraumatic.  Eyes: EOM are normal. Pupils are equal, round, and reactive to light.  Neck: Normal range of motion. Neck supple.  Cardiovascular: Normal rate and regular rhythm.  Exam reveals no gallop and no friction rub.   No murmur heard. Pulmonary/Chest: Effort normal. She has no wheezes. She has no rales.  Abdominal: Soft. She exhibits no distension and no mass. There is no tenderness. There is no guarding.  Musculoskeletal: She exhibits no edema or tenderness.  Neurological: She is alert and oriented to person, place, and time.  Skin: Skin is warm and dry. She is not diaphoretic.  Psychiatric: She has a normal mood and affect. Her behavior is normal.  Nursing note and vitals reviewed.    ED Treatments / Results  Labs (all labs ordered are listed, but only abnormal results are displayed) Labs Reviewed  URINALYSIS, ROUTINE W REFLEX MICROSCOPIC - Abnormal; Notable for the following:       Result Value   APPearance HAZY (*)    Hgb urine dipstick LARGE (*)    Ketones, ur 5 (*)    Leukocytes, UA TRACE (*)    Bacteria,  UA RARE (*)    Squamous Epithelial / LPF 6-30 (*)    All other components within normal limits  CBC WITH DIFFERENTIAL/PLATELET - Abnormal; Notable for the following:    Platelets 472 (*)    All other components within normal limits  COMPREHENSIVE METABOLIC PANEL - Abnormal; Notable for the following:    Glucose, Bld 108 (*)    All other components within normal limits  LIPASE, BLOOD  POC URINE PREG, ED    EKG  EKG Interpretation None       Radiology Ct Renal Stone Study  Result Date: 08/09/2016 CLINICAL DATA:  Right flank pain starting tonight.  EXAM: CT ABDOMEN AND PELVIS WITHOUT CONTRAST TECHNIQUE: Multidetector CT imaging of the abdomen and pelvis was performed following the standard protocol without IV contrast. COMPARISON:  None. FINDINGS: Lower chest: The lung bases are clear. Hepatobiliary: No focal liver abnormality is seen. No gallstones, gallbladder wall thickening, or biliary dilatation. Pancreas: Unremarkable. No pancreatic ductal dilatation or surrounding inflammatory changes. Spleen: Normal in size without focal abnormality. Adrenals/Urinary Tract: Adrenal glands are unremarkable. Kidneys are normal, without renal calculi, focal lesion, or hydronephrosis. Bladder is unremarkable. Stomach/Bowel: Stomach is within normal limits. Appendix appears normal. No evidence of bowel wall thickening, distention, or inflammatory changes. Vascular/Lymphatic: No significant vascular findings are present. No enlarged abdominal or pelvic lymph nodes. Reproductive: Uterus and bilateral adnexa are unremarkable. Other: Small periumbilical hernia containing fat. No free air or free fluid in the abdomen. Musculoskeletal: No acute or significant osseous findings. IMPRESSION: No renal or ureteral stone or obstruction. No acute process demonstrated in the abdomen or pelvis. Electronically Signed   By: Burman Nieves M.D.   On: 08/09/2016 06:07    Procedures Procedures (including critical care  time)  Medications Ordered in ED Medications - No data to display   Initial Impression / Assessment and Plan / ED Course  I have reviewed the triage vital signs and the nursing notes.  Pertinent labs & imaging results that were available during my care of the patient were reviewed by me and considered in my medical decision making (see chart for details).     36 yo F With a chief complaint of right flank pain. Will obtain a CT stone study to evaluate for possible kidney stone versus appendicitis.  CT scan clean, as patient gave UA, small stone on toilet paper, UA with blood, suspect passed stone.  Urology follow up.   6:41 AM:  I have discussed the diagnosis/risks/treatment options with the patient and family and believe the pt to be eligible for discharge home to follow-up with Urology. We also discussed returning to the ED immediately if new or worsening sx occur. We discussed the sx which are most concerning (e.g., sudden worsening pain, fever, inability to tolerate by mouth) that necessitate immediate return. Medications administered to the patient during their visit and any new prescriptions provided to the patient are listed below.  Medications given during this visit Medications - No data to display   The patient appears reasonably screen and/or stabilized for discharge and I doubt any other medical condition or other [facility] requiring further screening, evaluation, or treatment in the ED at this time prior to discharge.    Final Clinical Impressions(s) / ED Diagnoses   Final diagnoses:  RLQ abdominal pain    New Prescriptions New Prescriptions   No medications on file     Melene Plan, DO 08/09/16 2130

## 2016-08-09 NOTE — ED Triage Notes (Signed)
Patient reports awakening approx 0330 to severe, sharp right lower abdominal pain which radiated to R flank. Also, diaphoretic and nausea. Symptoms resolved at this time. R lower abd pain remains.

## 2016-08-09 NOTE — ED Notes (Signed)
Dr. Floyd at bedside at this time.  

## 2017-02-27 LAB — OB RESULTS CONSOLE HIV ANTIBODY (ROUTINE TESTING): HIV: NONREACTIVE

## 2017-02-27 LAB — OB RESULTS CONSOLE RUBELLA ANTIBODY, IGM: Rubella: IMMUNE

## 2017-02-27 LAB — OB RESULTS CONSOLE RPR: RPR: NONREACTIVE

## 2017-02-27 LAB — OB RESULTS CONSOLE GC/CHLAMYDIA
Chlamydia: NEGATIVE
Gonorrhea: NEGATIVE

## 2017-02-27 LAB — OB RESULTS CONSOLE ANTIBODY SCREEN: Antibody Screen: NEGATIVE

## 2017-02-27 LAB — OB RESULTS CONSOLE ABO/RH: RH TYPE: POSITIVE

## 2017-02-27 LAB — OB RESULTS CONSOLE HEPATITIS B SURFACE ANTIGEN: HEP B S AG: NEGATIVE

## 2017-03-31 NOTE — L&D Delivery Note (Signed)
Delivery Note Pt pushed very well x 2-3 ctx for delivery.  At 6:26 PM a viable and healthy female was delivered via Vaginal, Spontaneous (Presentation: OA; ROT  ).  APGAR: 9,9 ; weight P .   Placenta status: delivered, intact .  Cord: 3V with the following complications: none, wrapped around leg x 2 .   Anesthesia:epidural   Episiotomy: None Lacerations: 2nd degree;B Labial Suture Repair: 3.0 vicryl rapide Est. Blood Loss (mL):  250cc  Mom to postpartum.  Baby to Couplet care / Skin to Skin.  Kristina Bauer 09/03/2017, 6:51 PM   Br/RI/Tdap in PNC/O+  D/w pt r/b/a of PPBTL will try to do this evening.

## 2017-07-08 ENCOUNTER — Encounter: Payer: Self-pay | Admitting: Registered"

## 2017-07-08 ENCOUNTER — Encounter: Payer: Medicaid Other | Attending: Obstetrics and Gynecology | Admitting: Registered"

## 2017-07-08 DIAGNOSIS — Z713 Dietary counseling and surveillance: Secondary | ICD-10-CM | POA: Diagnosis present

## 2017-07-08 DIAGNOSIS — O99810 Abnormal glucose complicating pregnancy: Secondary | ICD-10-CM

## 2017-07-08 NOTE — Progress Notes (Signed)
Patient was seen on 07/08/17 for Gestational Diabetes self-management class at [facility]. The following learning objectives were met by the patient during this course:   States the definition of Gestational Diabetes  States why dietary management is important in controlling blood glucose  Describes the effects each nutrient has on blood glucose levels  Demonstrates ability to create a balanced meal plan  Demonstrates carbohydrate counting   States when to check blood glucose levels  Demonstrates proper blood glucose monitoring techniques  States the effect of stress and exercise on blood glucose levels  States the importance of limiting caffeine and abstaining from alcohol and smoking  Blood glucose monitor given: Accu-Chek Guide Lot # [id number] Exp: 07-04-2018 Blood glucose reading: 86  Patient instructed to monitor glucose levels: FBS: 60 - <95; 1 hour: <140; 2 hour: <120  Patient received handouts:  Nutrition Diabetes and Pregnancy, including carb counting list  Patient will be seen for follow-up as needed.

## 2017-07-14 IMAGING — CT CT RENAL STONE PROTOCOL
2 of 4 series · 17 of 46 positions shown, 19 images · non-contrast
Comparison: None.

CLINICAL DATA: Right flank pain starting tonight.

EXAM:
CT ABDOMEN AND PELVIS WITHOUT CONTRAST
TECHNIQUE: Multidetector CT imaging of the abdomen and pelvis was performed
following the standard protocol without IV contrast.

[Series 3: renal stone 5.0 · axial · 0.98mm/px · z∈[-432,+18]mm · 14 of 100 slices shown, 16 images]
[im 5/100  soft-tissue]
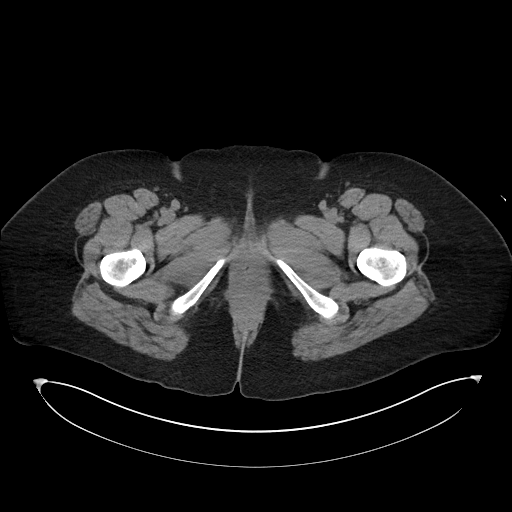
[im 5/100  bone]
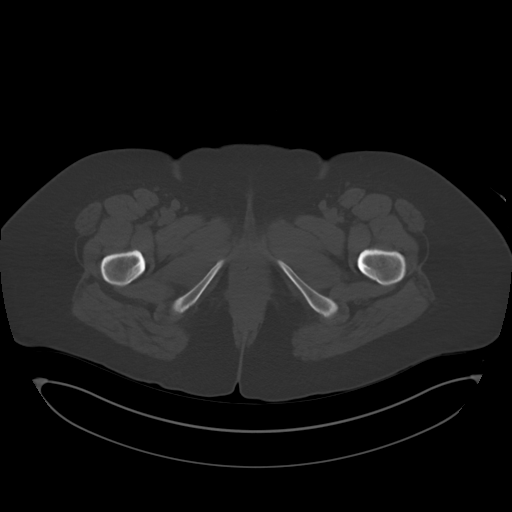
[im 13/100  soft-tissue]
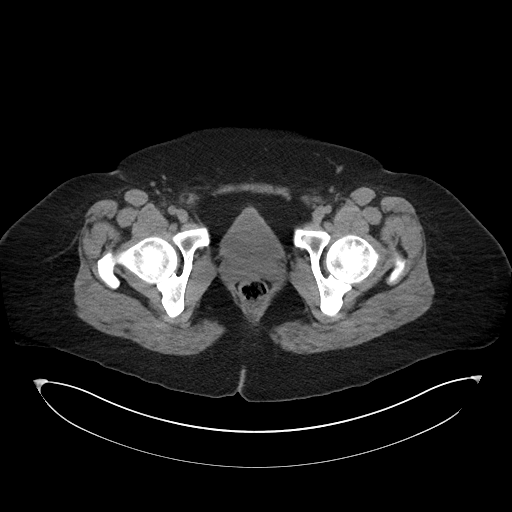
[im 18/100  soft-tissue]
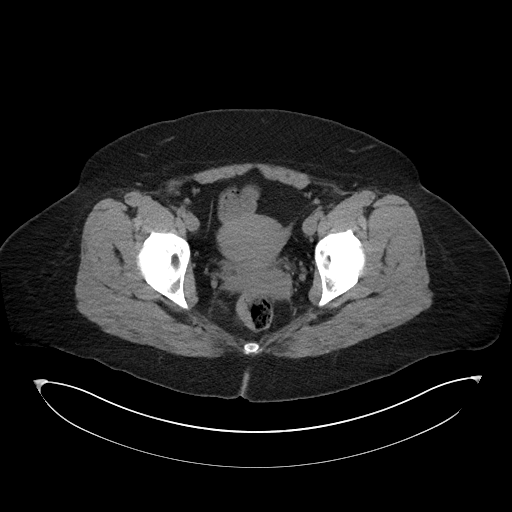
[im 26/100  soft-tissue]
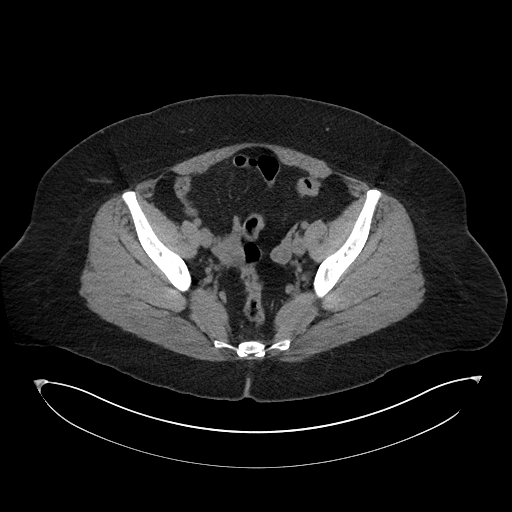
[im 35/100  soft-tissue]
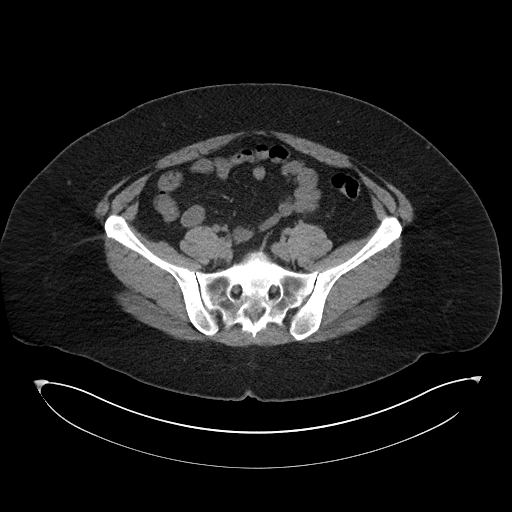
[im 39/100  soft-tissue]
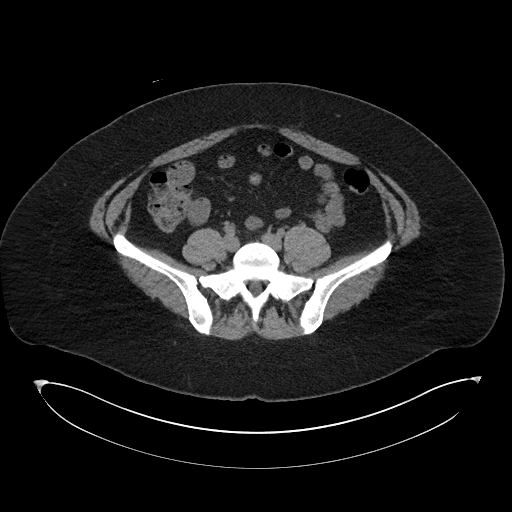
[im 48/100  soft-tissue]
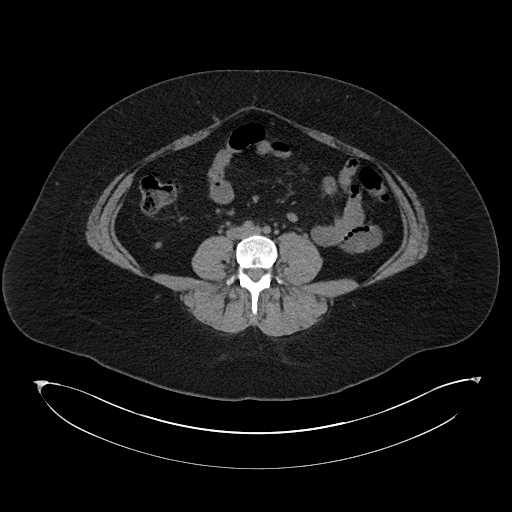
[im 52/100  soft-tissue]
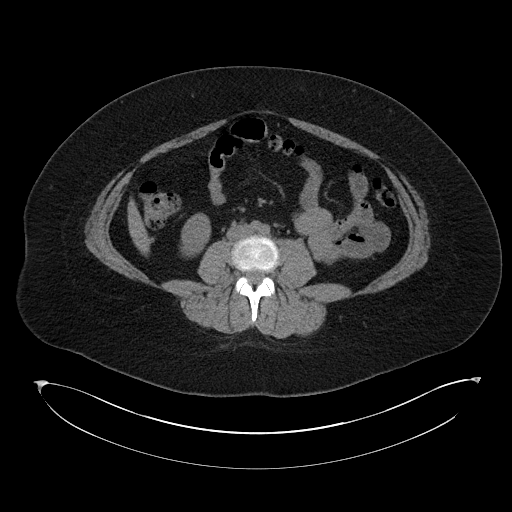
[im 61/100  soft-tissue]
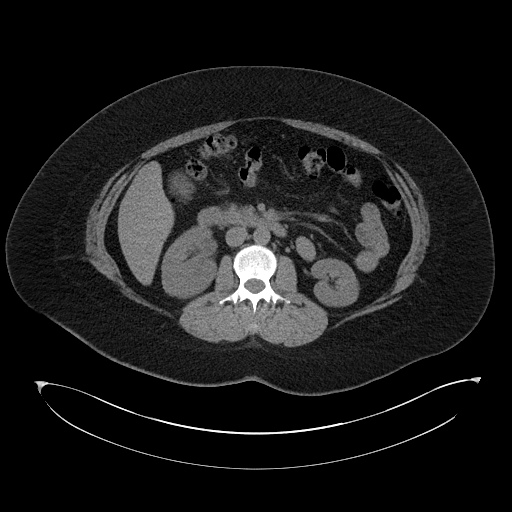
[im 61/100  bone]
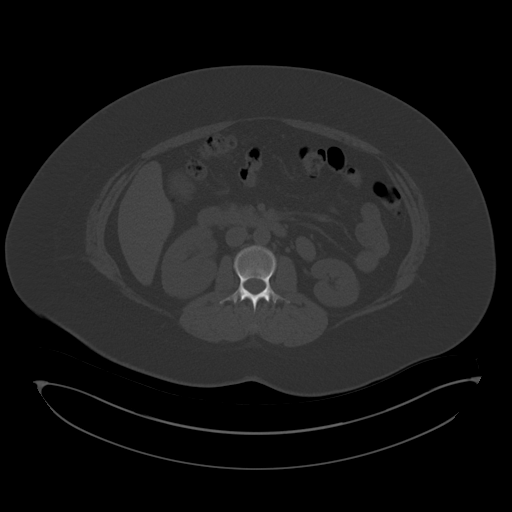
[im 65/100  soft-tissue]
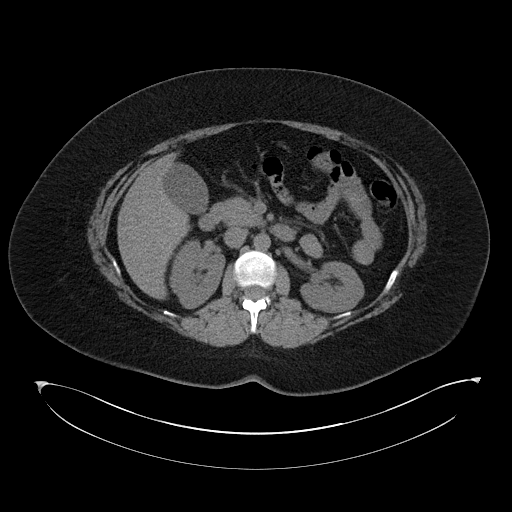
[im 74/100  soft-tissue]
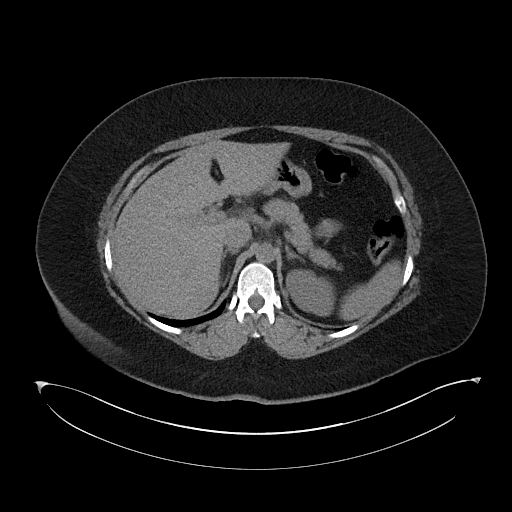
[im 82/100  soft-tissue]
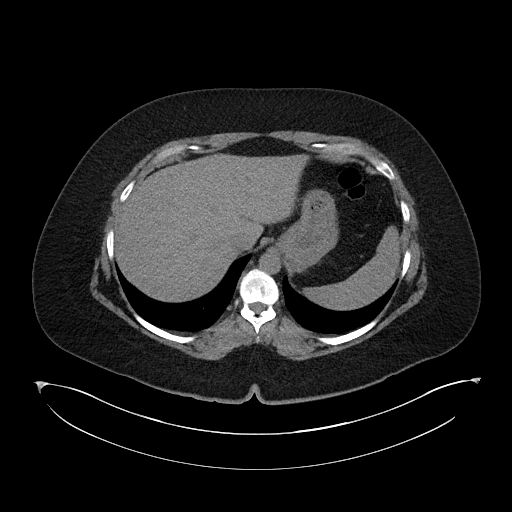
[im 87/100  soft-tissue]
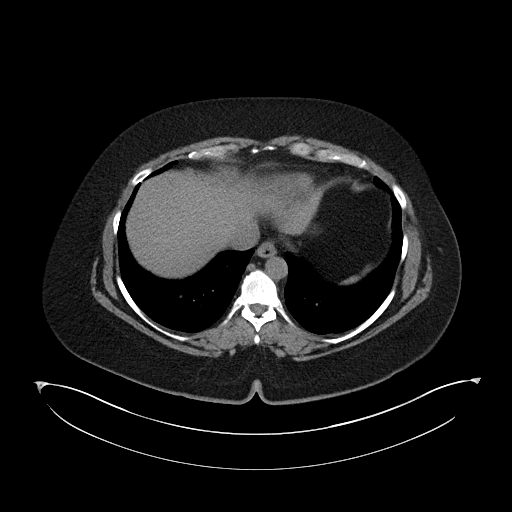
[im 95/100  soft-tissue]
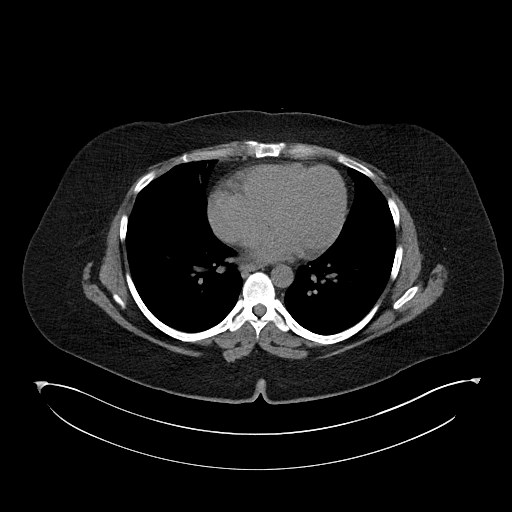

[Series 6: renal stone 3.0 cor · coronal · 0.97mm/px · 3 of 121 slices shown]
[im 41/121  soft-tissue]
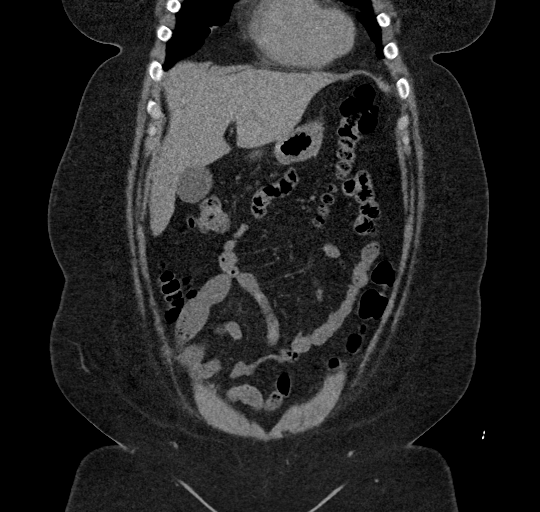
[im 54/121  soft-tissue]
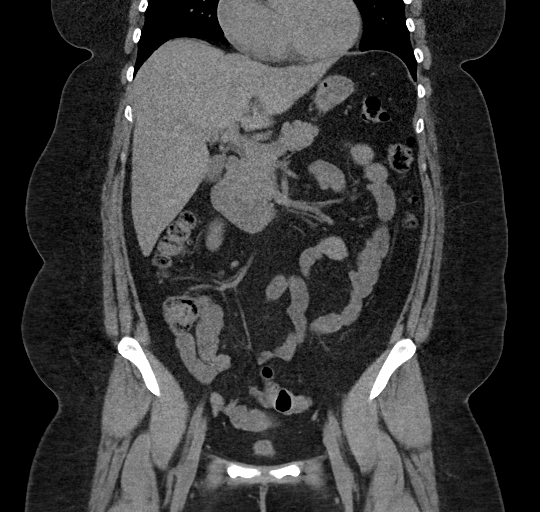
[im 67/121  soft-tissue]
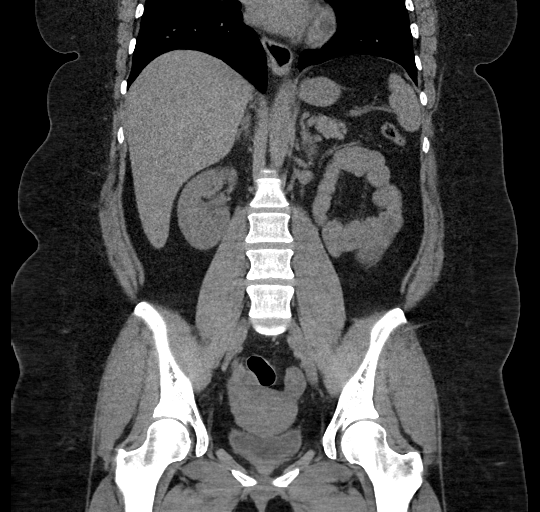

[17 of 46 positions shown; findings below may reference images not displayed]

FINDINGS: Lower chest: The lung bases are clear.

Hepatobiliary: No focal liver abnormality is seen. No gallstones,
gallbladder wall thickening, or biliary dilatation.

Pancreas: Unremarkable. No pancreatic ductal dilatation or
surrounding inflammatory changes.

Spleen: Normal in size without focal abnormality.

Adrenals/Urinary Tract: Adrenal glands are unremarkable. Kidneys are
normal, without renal calculi, focal lesion, or hydronephrosis.
Bladder is unremarkable.

Stomach/Bowel: Stomach is within normal limits. Appendix appears
normal. No evidence of bowel wall thickening, distention, or
inflammatory changes.

Vascular/Lymphatic: No significant vascular findings are present. No
enlarged abdominal or pelvic lymph nodes.

Reproductive: Uterus and bilateral adnexa are unremarkable.

Other: Small periumbilical hernia containing fat. No free air or
free fluid in the abdomen.

Musculoskeletal: No acute or significant osseous findings.
IMPRESSION: No renal or ureteral stone or obstruction. No acute process
demonstrated in the abdomen or pelvis.

## 2017-08-17 LAB — OB RESULTS CONSOLE GBS: STREP GROUP B AG: NEGATIVE

## 2017-08-26 ENCOUNTER — Telehealth (HOSPITAL_COMMUNITY): Payer: Self-pay | Admitting: *Deleted

## 2017-08-26 ENCOUNTER — Encounter (HOSPITAL_COMMUNITY): Payer: Self-pay | Admitting: *Deleted

## 2017-08-27 ENCOUNTER — Encounter (HOSPITAL_COMMUNITY): Payer: Self-pay | Admitting: *Deleted

## 2017-08-27 NOTE — Telephone Encounter (Signed)
Preadmission screen  

## 2017-09-02 NOTE — H&P (Signed)
Kristina Bauer is a 37 y.o. female [id number] at 37_ with GDM and PIH, poss CHTN, increasing proteinuria.  Pregnancy complicated by GDM - relatively well controlled with Metformin 1000mg  po qhs.  BP elevated to 140/80 at onset of PNC, relatively well-controlled thru pregnancy - some mildly elevated 140/80 measurements.  24 hr urine was 265mg , repeat 685mg .  Normal labs (platelets, Cr and LFTs)  H/o PPD - desires Zoloft postpartum.  Pregnancy also complicated  Advanced maternal age, sleep apnea, obesity and fibromyalgia.  EDC 09/19/17 by LMP cw first trimester US.    OB History    Gravida  4   Para  2   Term  2   Preterm      AB  1   Living  2     SAB  0   TAB  1   Ectopic      Multiple      Live Births  2         h/o TAB,  SVD 7#4 SVD female 2010 SVD 7#9 SVD female 2014  H/o abn pap - last WNL No STD  Past Medical History:  Diagnosis Date  . Abnormal Pap smear   . ADHD (attention deficit hyperactivity disorder)   . Anxiety   . Asthma   . Depression    hospitalized 2007 took zoloft  . Fibromyalgia   . GERD (gastroesophageal reflux disease)   . Gestational diabetes    metformin  . Headache(784.0)   . Herniated disc, cervical   . Hx of pre-eclampsia in prior pregnancy, currently pregnant   . IBS (irritable bowel syndrome)   . Normal pregnancy, repeat 07/08/2012  . Obesity   . PONV (postoperative nausea and vomiting)   . Pregnancy induced hypertension   . Sleep apnea   . SVD (spontaneous vaginal delivery) 07/09/2012  . Vaginal Pap smear, abnormal    Past Surgical History:  Procedure Laterality Date  . COLPOSCOPY    . wisdom teeth extracted     Family History: family history includes Anxiety disorder in her mother; Cancer in her maternal grandmother, paternal grandfather, and paternal grandmother; Depression in her mother; Diabetes in her father, maternal grandmother, paternal grandfather, and sister; Fibromyalgia in her sister; Heart disease in her maternal  grandfather; Hypertension in her father and mother. Social History:  reports that she quit smoking about 9 years ago. She has never used smokeless tobacco. She reports that she does not drink alcohol or use drugs. married, SAHM Meds metformin 1000mg  qhs All NKDA     Maternal Diabetes: Yes:  Diabetes Type:  Insulin/Medication controlled Genetic Screening: Normal Maternal Ultrasounds/Referrals: Normal Fetal Ultrasounds or other Referrals:  None Maternal Substance Abuse:  No Significant Maternal Medications:  None Significant Maternal Lab Results:  Lab values include: Group B Strep negative Other Comments:  Panorama no result, repeat low risk female  Review of Systems  Constitutional: Negative.   HENT: Negative.   Eyes: Negative.   Respiratory: Negative.   Cardiovascular: Negative.   Gastrointestinal: Negative.   Genitourinary: Negative.   Musculoskeletal: Negative.   Skin: Negative.   Neurological: Negative.   Psychiatric/Behavioral: Negative.    Maternal Medical History:  Contractions: Frequency: irregular.   Perceived severity is mild.    Fetal activity: Perceived fetal activity is normal.    Prenatal complications: PIH and pre-eclampsia.   Prenatal Complications - Diabetes: gestational. Diabetes is managed by oral agent (monotherapy).        Last menstrual period 12/13/2016. Maternal Exam:  Uterine Assessment: Contraction frequency is irregular.   Abdomen: Patient reports no abdominal tenderness. Fundal height is appropriate for gestation.   Estimated fetal weight is 7.5-8#.   Fetal presentation: vertex  Introitus: Normal vulva. Normal vagina.  Pelvis: adequate for delivery.      Physical Exam  Constitutional: She is oriented to person, place, and time. She appears well-developed and well-nourished.  HENT:  Head: Normocephalic and atraumatic.  Cardiovascular: Normal rate and regular rhythm.  Respiratory: Effort normal and breath sounds normal. No  respiratory distress. She has no wheezes.  GI: Soft. Bowel sounds are normal. There is no tenderness.  Neurological: She is alert and oriented to person, place, and time.  Skin: Skin is warm and dry.  Psychiatric: She has a normal mood and affect. Her behavior is normal.    Prenatal labs: ABO, Rh: O/Positive/-- (11/30 0000) Antibody: Negative (11/30 0000) Rubella: Immune (11/30 0000) RPR: Nonreactive (11/30 0000)  HBsAg: Negative (11/30 0000)  HIV: Non-reactive (11/30 0000)  GBS:   negative  Hgb13.0/Plt 405/Ur Cx neg/GC neg/Chl neg/Varicella immune/glucola 168 - pt desired to folllow FSBS/ nl NT/ panorama insufficient cells, repeat low risk female  [place] B CP cysts, nl anat, post plac, female Protein 685, nl LFTs, plts, Cr   Assessment/Plan: 37yo [id number] at 37+ with GDM and PIH gbbs negative AROM and pitocin to augment Epidural prn Expect SVD   Joanne Brander Bovard-Stuckert 09/02/2017, 10:05 PM

## 2017-09-03 ENCOUNTER — Inpatient Hospital Stay (HOSPITAL_COMMUNITY): Payer: Medicaid Other | Admitting: Anesthesiology

## 2017-09-03 ENCOUNTER — Encounter (HOSPITAL_COMMUNITY): Payer: Self-pay

## 2017-09-03 ENCOUNTER — Encounter (HOSPITAL_COMMUNITY)
Admission: RE | Disposition: A | Discharge status: Home / Self Care | Payer: Self-pay | Source: Home / Self Care | Attending: Obstetrics and Gynecology

## 2017-09-03 ENCOUNTER — Inpatient Hospital Stay (HOSPITAL_COMMUNITY): Payer: Medicaid Other | Admitting: Certified Registered Nurse Anesthetist

## 2017-09-03 ENCOUNTER — Inpatient Hospital Stay (HOSPITAL_COMMUNITY)
Admission: RE | Admit: 2017-09-03 | Discharge: 2017-09-05 | DRG: 798 | Disposition: A | Discharge status: Home / Self Care | Payer: Medicaid Other | Attending: Obstetrics and Gynecology | Admitting: Obstetrics and Gynecology

## 2017-09-03 DIAGNOSIS — Z302 Encounter for sterilization: Secondary | ICD-10-CM | POA: Diagnosis not present

## 2017-09-03 DIAGNOSIS — Z87891 Personal history of nicotine dependence: Secondary | ICD-10-CM | POA: Diagnosis not present

## 2017-09-03 DIAGNOSIS — O134 Gestational [pregnancy-induced] hypertension without significant proteinuria, complicating childbirth: Secondary | ICD-10-CM | POA: Diagnosis present

## 2017-09-03 DIAGNOSIS — O9962 Diseases of the digestive system complicating childbirth: Secondary | ICD-10-CM | POA: Diagnosis present

## 2017-09-03 DIAGNOSIS — K219 Gastro-esophageal reflux disease without esophagitis: Secondary | ICD-10-CM | POA: Diagnosis present

## 2017-09-03 DIAGNOSIS — O99345 Other mental disorders complicating the puerperium: Secondary | ICD-10-CM | POA: Diagnosis not present

## 2017-09-03 DIAGNOSIS — O99214 Obesity complicating childbirth: Secondary | ICD-10-CM | POA: Diagnosis present

## 2017-09-03 DIAGNOSIS — Z8759 Personal history of other complications of pregnancy, childbirth and the puerperium: Secondary | ICD-10-CM | POA: Diagnosis not present

## 2017-09-03 DIAGNOSIS — Z3A37 37 weeks gestation of pregnancy: Secondary | ICD-10-CM

## 2017-09-03 DIAGNOSIS — O24425 Gestational diabetes mellitus in childbirth, controlled by oral hypoglycemic drugs: Principal | ICD-10-CM | POA: Diagnosis present

## 2017-09-03 DIAGNOSIS — F419 Anxiety disorder, unspecified: Secondary | ICD-10-CM | POA: Diagnosis not present

## 2017-09-03 DIAGNOSIS — O24415 Gestational diabetes mellitus in pregnancy, controlled by oral hypoglycemic drugs: Secondary | ICD-10-CM | POA: Diagnosis present

## 2017-09-03 DIAGNOSIS — R030 Elevated blood-pressure reading, without diagnosis of hypertension: Secondary | ICD-10-CM | POA: Diagnosis present

## 2017-09-03 HISTORY — PX: TUBAL LIGATION: SHX77

## 2017-09-03 LAB — COMPREHENSIVE METABOLIC PANEL
ALK PHOS: 92 U/L (ref 38–126)
ALT: 12 U/L — AB (ref 14–54)
AST: 19 U/L (ref 15–41)
Albumin: 2.7 g/dL — ABNORMAL LOW (ref 3.5–5.0)
Anion gap: 10 (ref 5–15)
BILIRUBIN TOTAL: 0.5 mg/dL (ref 0.3–1.2)
BUN: 15 mg/dL (ref 6–20)
CALCIUM: 9.1 mg/dL (ref 8.9–10.3)
CO2: 18 mmol/L — ABNORMAL LOW (ref 22–32)
Chloride: 107 mmol/L (ref 101–111)
Creatinine, Ser: 0.48 mg/dL (ref 0.44–1.00)
GFR calc Af Amer: >60 mL/min (ref >60)
Glucose, Bld: 95 mg/dL (ref 65–99)
Potassium: 4.2 mmol/L (ref 3.5–5.1)
Sodium: 135 mmol/L (ref 135–145)
TOTAL PROTEIN: 6.2 g/dL — AB (ref 6.5–8.1)

## 2017-09-03 LAB — TYPE AND SCREEN
ABO/RH(D): O POS
Antibody Screen: NEGATIVE

## 2017-09-03 LAB — CBC
HCT: 36.8 % (ref 36.0–46.0)
Hemoglobin: 12.7 g/dL (ref 12.0–15.0)
MCH: 31.9 pg (ref 26.0–34.0)
MCHC: 34.5 g/dL (ref 30.0–36.0)
MCV: 92.5 fL (ref 78.0–100.0)
PLATELETS: 381 10*3/uL (ref 150–400)
RBC: 3.98 MIL/uL (ref 3.87–5.11)
RDW: 13.6 % (ref 11.5–15.5)
WBC: 12.8 10*3/uL — ABNORMAL HIGH (ref 4.0–10.5)

## 2017-09-03 LAB — GLUCOSE, CAPILLARY
GLUCOSE-CAPILLARY: 77 mg/dL (ref 65–99)
GLUCOSE-CAPILLARY: 74 mg/dL (ref 65–99)
GLUCOSE-CAPILLARY: 81 mg/dL (ref 65–99)
Glucose-Capillary: 83 mg/dL (ref 65–99)

## 2017-09-03 LAB — RPR: RPR Ser Ql: NONREACTIVE

## 2017-09-03 SURGERY — LIGATION, FALLOPIAN TUBE, POSTPARTUM
Anesthesia: Epidural | Laterality: Bilateral

## 2017-09-03 SURGERY — LIGATION, FALLOPIAN TUBE, BILATERAL
Anesthesia: Epidural | Laterality: Bilateral

## 2017-09-03 MED ORDER — EPHEDRINE 5 MG/ML INJ: 10.0000 mg | INTRAVENOUS | Status: DC | PRN

## 2017-09-03 MED ORDER — OXYTOCIN 40 UNITS IN LACTATED RINGERS INFUSION - SIMPLE MED: 2.5000 [IU]/h | INTRAVENOUS | Status: DC

## 2017-09-03 MED ORDER — MIDAZOLAM HCL 2 MG/2ML IJ SOLN
INTRAMUSCULAR | Status: AC
  Filled 2017-09-03: qty 2

## 2017-09-03 MED ORDER — WITCH HAZEL-GLYCERIN EX PADS: 1.0000 "application " | MEDICATED_PAD | CUTANEOUS | Status: DC | PRN

## 2017-09-03 MED ORDER — PRENATAL MULTIVITAMIN CH
1.0000 | ORAL_TABLET | Freq: Every day | ORAL | Status: DC
  Administered 2017-09-04: 1 via ORAL
  Filled 2017-09-03: qty 1

## 2017-09-03 MED ORDER — ACETAMINOPHEN 325 MG PO TABS: 650.0000 mg | ORAL_TABLET | ORAL | Status: DC | PRN

## 2017-09-03 MED ORDER — LACTATED RINGERS IV SOLN
INTRAVENOUS | Status: DC
  Administered 2017-09-03 (×4): via INTRAVENOUS

## 2017-09-03 MED ORDER — LACTATED RINGERS IV SOLN: INTRAVENOUS | Status: DC

## 2017-09-03 MED ORDER — BUPIVACAINE HCL (PF) 0.25 % IJ SOLN
INTRAMUSCULAR | Status: DC | PRN
  Administered 2017-09-03: 30 mL

## 2017-09-03 MED ORDER — ZOLPIDEM TARTRATE 5 MG PO TABS: 5.0000 mg | ORAL_TABLET | Freq: Every evening | ORAL | Status: DC | PRN

## 2017-09-03 MED ORDER — ONDANSETRON HCL 4 MG/2ML IJ SOLN
INTRAMUSCULAR | Status: DC | PRN
  Administered 2017-09-03: 4 mg via INTRAVENOUS

## 2017-09-03 MED ORDER — LIDOCAINE-EPINEPHRINE (PF) 2 %-1:200000 IJ SOLN
INTRAMUSCULAR | Status: AC
  Filled 2017-09-03: qty 20

## 2017-09-03 MED ORDER — IBUPROFEN 600 MG PO TABS
600.0000 mg | ORAL_TABLET | Freq: Four times a day (QID) | ORAL | Status: DC
  Administered 2017-09-04 – 2017-09-05 (×5): 600 mg via ORAL
  Filled 2017-09-03 (×5): qty 1

## 2017-09-03 MED ORDER — DIPHENHYDRAMINE HCL 50 MG/ML IJ SOLN: 12.5000 mg | INTRAMUSCULAR | Status: DC | PRN

## 2017-09-03 MED ORDER — LIDOCAINE HCL (PF) 1 % IJ SOLN: 30.0000 mL | INTRAMUSCULAR | Status: DC | PRN

## 2017-09-03 MED ORDER — FENTANYL CITRATE (PF) 100 MCG/2ML IJ SOLN
INTRAMUSCULAR | Status: AC
  Filled 2017-09-03: qty 2

## 2017-09-03 MED ORDER — MIDAZOLAM HCL 5 MG/5ML IJ SOLN
INTRAMUSCULAR | Status: DC | PRN
  Administered 2017-09-03: 2 mg via INTRAVENOUS

## 2017-09-03 MED ORDER — OXYCODONE-ACETAMINOPHEN 5-325 MG PO TABS: 2.0000 | ORAL_TABLET | ORAL | Status: DC | PRN

## 2017-09-03 MED ORDER — STERILE WATER FOR IRRIGATION IR SOLN
Status: DC | PRN
  Administered 2017-09-03: 1000 mL

## 2017-09-03 MED ORDER — PHENYLEPHRINE 40 MCG/ML (10ML) SYRINGE FOR IV PUSH (FOR BLOOD PRESSURE SUPPORT): 80.0000 ug | PREFILLED_SYRINGE | INTRAVENOUS | Status: DC | PRN

## 2017-09-03 MED ORDER — LIDOCAINE HCL (PF) 1 % IJ SOLN
INTRAMUSCULAR | Status: DC | PRN
  Administered 2017-09-03: 3 mL via EPIDURAL
  Administered 2017-09-03: 5 mL via EPIDURAL
  Administered 2017-09-03: 2 mL via EPIDURAL

## 2017-09-03 MED ORDER — ONDANSETRON HCL 4 MG/2ML IJ SOLN: 4.0000 mg | INTRAMUSCULAR | Status: DC | PRN

## 2017-09-03 MED ORDER — SODIUM BICARBONATE 8.4 % IV SOLN
INTRAVENOUS | Status: AC
  Filled 2017-09-03: qty 50

## 2017-09-03 MED ORDER — PHENYLEPHRINE 40 MCG/ML (10ML) SYRINGE FOR IV PUSH (FOR BLOOD PRESSURE SUPPORT)
80.0000 ug | PREFILLED_SYRINGE | INTRAVENOUS | Status: DC | PRN
  Filled 2017-09-03: qty 10

## 2017-09-03 MED ORDER — COCONUT OIL OIL: 1.0000 "application " | TOPICAL_OIL | Status: DC | PRN

## 2017-09-03 MED ORDER — DIBUCAINE 1 % RE OINT: 1.0000 "application " | TOPICAL_OINTMENT | RECTAL | Status: DC | PRN

## 2017-09-03 MED ORDER — ONDANSETRON HCL 4 MG/2ML IJ SOLN
INTRAMUSCULAR | Status: AC
Start: 2017-09-03 — End: ?
  Filled 2017-09-03: qty 2

## 2017-09-03 MED ORDER — FLUOXETINE HCL 20 MG PO CAPS
20.0000 mg | ORAL_CAPSULE | Freq: Every day | ORAL | Status: DC
  Administered 2017-09-04 – 2017-09-05 (×2): 20 mg via ORAL
  Filled 2017-09-03 (×3): qty 1

## 2017-09-03 MED ORDER — DIPHENHYDRAMINE HCL 25 MG PO CAPS: 25.0000 mg | ORAL_CAPSULE | Freq: Four times a day (QID) | ORAL | Status: DC | PRN

## 2017-09-03 MED ORDER — BUTORPHANOL TARTRATE 1 MG/ML IJ SOLN: 1.0000 mg | INTRAMUSCULAR | Status: DC | PRN

## 2017-09-03 MED ORDER — OXYTOCIN BOLUS FROM INFUSION
500.0000 mL | Freq: Once | INTRAVENOUS | Status: AC
  Administered 2017-09-03: 500 mL via INTRAVENOUS

## 2017-09-03 MED ORDER — ONDANSETRON HCL 4 MG/2ML IJ SOLN: 4.0000 mg | Freq: Four times a day (QID) | INTRAMUSCULAR | Status: DC | PRN

## 2017-09-03 MED ORDER — OXYCODONE-ACETAMINOPHEN 5-325 MG PO TABS: 1.0000 | ORAL_TABLET | ORAL | Status: DC | PRN

## 2017-09-03 MED ORDER — TETANUS-DIPHTH-ACELL PERTUSSIS 5-2.5-18.5 LF-MCG/0.5 IM SUSP: 0.5000 mL | Freq: Once | INTRAMUSCULAR | Status: DC

## 2017-09-03 MED ORDER — SENNOSIDES-DOCUSATE SODIUM 8.6-50 MG PO TABS
2.0000 | ORAL_TABLET | ORAL | Status: DC
  Administered 2017-09-04 (×2): 2 via ORAL
  Filled 2017-09-03 (×2): qty 2

## 2017-09-03 MED ORDER — SIMETHICONE 80 MG PO CHEW: 80.0000 mg | CHEWABLE_TABLET | ORAL | Status: DC | PRN

## 2017-09-03 MED ORDER — SODIUM BICARBONATE 8.4 % IV SOLN
INTRAVENOUS | Status: DC | PRN
  Administered 2017-09-03 (×2): 10 mL via EPIDURAL

## 2017-09-03 MED ORDER — PANTOPRAZOLE SODIUM 40 MG PO TBEC
40.0000 mg | DELAYED_RELEASE_TABLET | Freq: Every day | ORAL | Status: DC
  Administered 2017-09-03 – 2017-09-05 (×3): 40 mg via ORAL
  Filled 2017-09-03 (×3): qty 1

## 2017-09-03 MED ORDER — BENZOCAINE-MENTHOL 20-0.5 % EX AERO
1.0000 "application " | INHALATION_SPRAY | CUTANEOUS | Status: DC | PRN
  Filled 2017-09-03: qty 56

## 2017-09-03 MED ORDER — SODIUM CHLORIDE 0.9 % IV BOLUS
INTRAVENOUS | Status: AC | PRN
  Administered 2017-09-03: 1000 mL via INTRAVENOUS

## 2017-09-03 MED ORDER — KETOROLAC TROMETHAMINE 30 MG/ML IJ SOLN
INTRAMUSCULAR | Status: DC | PRN
  Administered 2017-09-03: 30 mg via INTRAVENOUS

## 2017-09-03 MED ORDER — ACETAMINOPHEN 325 MG PO TABS
650.0000 mg | ORAL_TABLET | ORAL | Status: DC | PRN
  Administered 2017-09-04 – 2017-09-05 (×5): 650 mg via ORAL
  Filled 2017-09-03 (×6): qty 2

## 2017-09-03 MED ORDER — FENTANYL 2.5 MCG/ML BUPIVACAINE 1/10 % EPIDURAL INFUSION (WH - ANES)
14.0000 mL/h | INTRAMUSCULAR | Status: DC | PRN
  Administered 2017-09-03: 14 mL/h via EPIDURAL
  Filled 2017-09-03: qty 100

## 2017-09-03 MED ORDER — LACTATED RINGERS IV SOLN: 500.0000 mL | INTRAVENOUS | Status: DC | PRN

## 2017-09-03 MED ORDER — TERBUTALINE SULFATE 1 MG/ML IJ SOLN: 0.2500 mg | Freq: Once | INTRAMUSCULAR | Status: DC | PRN

## 2017-09-03 MED ORDER — ONDANSETRON HCL 4 MG PO TABS: 4.0000 mg | ORAL_TABLET | ORAL | Status: DC | PRN

## 2017-09-03 MED ORDER — FENTANYL CITRATE (PF) 100 MCG/2ML IJ SOLN
INTRAMUSCULAR | Status: DC | PRN
  Administered 2017-09-03: 100 ug via INTRAVENOUS

## 2017-09-03 MED ORDER — OXYTOCIN 40 UNITS IN LACTATED RINGERS INFUSION - SIMPLE MED
1.0000 m[IU]/min | INTRAVENOUS | Status: DC
  Administered 2017-09-03: 2 m[IU]/min via INTRAVENOUS
  Filled 2017-09-03: qty 1000

## 2017-09-03 MED ORDER — LACTATED RINGERS IV SOLN
500.0000 mL | Freq: Once | INTRAVENOUS | Status: AC
  Administered 2017-09-03: 500 mL via INTRAVENOUS

## 2017-09-03 MED ORDER — SOD CITRATE-CITRIC ACID 500-334 MG/5ML PO SOLN
30.0000 mL | ORAL | Status: DC | PRN
  Administered 2017-09-03: 30 mL via ORAL
  Filled 2017-09-03: qty 15

## 2017-09-03 MED ORDER — LACTATED RINGERS IV SOLN: 500.0000 mL | Freq: Once | INTRAVENOUS | Status: DC

## 2017-09-03 SURGICAL SUPPLY — 32 items
BENZOIN TINCTURE PRP APPL 2/3 (GAUZE/BANDAGES/DRESSINGS) ×3 IMPLANT
BLADE SURG 11 STRL SS (BLADE) ×3 IMPLANT
CLIP FILSHIE TUBAL LIGA STRL (Clip) ×6 IMPLANT
CLOSURE STERI-STRIP 1/2X4 (GAUZE/BANDAGES/DRESSINGS) ×1
CLOSURE WOUND 1/2 X4 (GAUZE/BANDAGES/DRESSINGS) ×1
CLOTH BEACON ORANGE TIMEOUT ST (SAFETY) ×3 IMPLANT
CLSR STERI-STRIP ANTIMIC 1/2X4 (GAUZE/BANDAGES/DRESSINGS) ×2 IMPLANT
DRSG OPSITE POSTOP 3X4 (GAUZE/BANDAGES/DRESSINGS) ×3 IMPLANT
DURAPREP 26ML APPLICATOR (WOUND CARE) ×3 IMPLANT
ELECT REM PT RETURN 9FT ADLT (ELECTROSURGICAL) ×3
ELECTRODE REM PT RTRN 9FT ADLT (ELECTROSURGICAL) ×1 IMPLANT
GLOVE BIO SURGEON STRL SZ 6.5 (GLOVE) ×4 IMPLANT
GLOVE BIO SURGEONS STRL SZ 6.5 (GLOVE) ×2
GLOVE BIOGEL PI IND STRL 7.0 (GLOVE) ×1 IMPLANT
GLOVE BIOGEL PI INDICATOR 7.0 (GLOVE) ×2
GOWN STRL REUS W/TWL LRG LVL3 (GOWN DISPOSABLE) ×6 IMPLANT
NEEDLE HYPO 22GX1.5 SAFETY (NEEDLE) IMPLANT
NS IRRIG 1000ML POUR BTL (IV SOLUTION) ×3 IMPLANT
PACK ABDOMINAL MINOR (CUSTOM PROCEDURE TRAY) ×3 IMPLANT
PENCIL BUTTON HOLSTER BLD 10FT (ELECTRODE) IMPLANT
PROTECTOR NERVE ULNAR (MISCELLANEOUS) ×3 IMPLANT
SPONGE LAP 18X18 X RAY DECT (DISPOSABLE) ×3 IMPLANT
SPONGE LAP 4X18 X RAY DECT (DISPOSABLE) ×3 IMPLANT
STRIP CLOSURE SKIN 1/2X4 (GAUZE/BANDAGES/DRESSINGS) ×2 IMPLANT
SUT PLAIN 0 NONE (SUTURE) ×3 IMPLANT
SUT VIC AB 0 CT1 27 (SUTURE) ×2
SUT VIC AB 0 CT1 27XBRD ANBCTR (SUTURE) ×1 IMPLANT
SUT VIC AB 2-0 CT1 (SUTURE) ×3 IMPLANT
SUT VIC AB 3-0 FS2 27 (SUTURE) ×3 IMPLANT
SYR CONTROL 10ML LL (SYRINGE) IMPLANT
TOWEL OR 17X24 6PK STRL BLUE (TOWEL DISPOSABLE) ×6 IMPLANT
TRAY FOLEY CATH SILVER 14FR (SET/KITS/TRAYS/PACK) ×3 IMPLANT

## 2017-09-03 NOTE — Progress Notes (Addendum)
Patient ID: Kristina Bauer, female   DOB: 11/13/1980, 37 y.o.   MRN: [id number]   Pt desired to restart Prozac PP was on 40mg , will start at 20mg  and work-up to.  Pt has h/o PPD.   D/C [facility]

## 2017-09-03 NOTE — Brief Op Note (Signed)
09/03/2017  11:03 PM  PATIENT:  Kristina Bauer  37 y.o. female  PRE-OPERATIVE DIAGNOSIS:  BTL; desired sterilization   POST-OPERATIVE DIAGNOSIS:  BTL by filshie clips; desired sterilization   PROCEDURE:  Procedure(s): POST PARTUM TUBAL LIGATION (Bilateral) by Filshie Clips  SURGEON:  Surgeon(s) and Role:    * Bovard-Stuckert, Latroya Ng, MD - Primary  ANESTHESIA:   local and epidural  EBL:  5 mL uop and IVF per anesthesia records  BLOOD ADMINISTERED:none  DRAINS: none   LOCAL MEDICATIONS USED:  MARCAINE     SPECIMEN:  No Specimen  DISPOSITION OF SPECIMEN:  N/A  COUNTS:  YES  TOURNIQUET:  * No tourniquets in log *  DICTATION: .Other Dictation: Dictation Number [id number]  PLAN OF CARE: Admit to inpatient   PATIENT DISPOSITION:  PACU - hemodynamically stable.   Delay start of Pharmacological VTE agent (>24hrs) due to surgical blood loss or risk of bleeding: not applicable

## 2017-09-03 NOTE — Anesthesia Preprocedure Evaluation (Signed)
Anesthesia Evaluation  Patient identified by MRN, date of birth, ID band Patient awake    Reviewed: Allergy & Precautions  Airway Mallampati: III       Dental   Pulmonary asthma , sleep apnea , former smoker,    Pulmonary exam normal        Cardiovascular hypertension, Normal cardiovascular exam     Neuro/Psych Anxiety  Neuromuscular disease    GI/Hepatic Neg liver ROS, GERD  ,  Endo/Other  diabetes, GestationalMorbid obesity  Renal/GU negative Renal ROS     Musculoskeletal  (+) Fibromyalgia -  Abdominal   Peds  Hematology negative hematology ROS (+)   Anesthesia Other Findings   Reproductive/Obstetrics (+) Pregnancy                             Lab Results  Component Value Date   WBC 12.8 (H) 09/03/2017   HGB 12.7 09/03/2017   HCT 36.8 09/03/2017   MCV 92.5 09/03/2017   PLT 381 09/03/2017    Anesthesia Physical Anesthesia Plan  ASA: III  Anesthesia Plan: Epidural   Post-op Pain Management:    Induction:   PONV Risk Score and Plan: Treatment may vary due to age or medical condition  Airway Management Planned: Natural Airway  Additional Equipment:   Intra-op Plan:   Post-operative Plan:   Informed Consent: I have reviewed the patients History and Physical, chart, labs and discussed the procedure including the risks, benefits and alternatives for the proposed anesthesia with the patient or authorized representative who has indicated his/her understanding and acceptance.     Plan Discussed with:   Anesthesia Plan Comments:         Anesthesia Quick Evaluation

## 2017-09-03 NOTE — Progress Notes (Signed)
Patient ID: Glory BuffElizabeth R Fait, female   DOB: 08/09/1980, 37 y.o.   MRN: [id number]   Doing well.    AFVSS gen NAD FHTs 130's, mod var, + accels, category 1 toc q 3-304min  AROM for small clear fluid  Continue IOL Expect SVD

## 2017-09-03 NOTE — Anesthesia Procedure Notes (Signed)
Epidural Patient location during procedure: OB Start time: 09/03/2017 1:30 PM End time: 09/03/2017 1:36 PM  Staffing Anesthesiologist: Marcene DuosFitzgerald, Ardyn Forge, MD Performed: anesthesiologist   Preanesthetic Checklist Completed: patient identified, site marked, surgical consent, pre-op evaluation, timeout performed, IV checked, risks and benefits discussed and monitors and equipment checked  Epidural Patient position: sitting Prep: site prepped and draped and DuraPrep Patient monitoring: continuous pulse ox and blood pressure Approach: midline Location: L3-L4 Injection technique: LOR air  Needle:  Needle type: Tuohy  Needle gauge: 17 G Needle length: 9 cm and 9 Needle insertion depth: 8 cm Catheter type: closed end flexible Catheter size: 19 Gauge Catheter at skin depth: 16 cm Test dose: negative  Assessment Events: blood not aspirated, injection not painful, no injection resistance, negative IV test and no paresthesia

## 2017-09-03 NOTE — Anesthesia Pain Management Evaluation Note (Signed)
  CRNA Pain Management Visit Note  Patient: Kristina BuffElizabeth R Loveday, 37 y.o., female  "Hello I am a member of the anesthesia team at [facility]. We have an anesthesia team available at all times to provide care throughout the hospital, including epidural management and anesthesia for C-section. I don't know your plan for the delivery whether it a natural birth, water birth, IV sedation, nitrous supplementation, doula or epidural, but we want to meet your pain goals."   1.Was your pain managed to your expectations on prior hospitalizations?   No prior hospitalizations  2.What is your expectation for pain management during this hospitalization?     Epidural  3.How can we help you reach that goal? Main tain epidural until delivery of baby.  Record the patient's initial score and the patient's pain goal.   Pain: 0  Pain Goal: 5 [facility] wants you to be able to say your pain was always managed very well.  Khloey Chern 09/03/2017

## 2017-09-03 NOTE — Anesthesia Postprocedure Evaluation (Signed)
Anesthesia Post Note  Patient: Kristina Bauer  Procedure(s) Performed: POST PARTUM TUBAL LIGATION (Bilateral )     Patient location during evaluation: PACU Anesthesia Type: Epidural Level of consciousness: awake and alert and oriented Pain management: pain level controlled Vital Signs Assessment: post-procedure vital signs reviewed and stable Respiratory status: spontaneous breathing, nonlabored ventilation and respiratory function stable Cardiovascular status: stable and blood pressure returned to baseline Postop Assessment: no headache, no backache, epidural receding, patient able to bend at knees and no apparent nausea or vomiting Anesthetic complications: no    Last Vitals:  Vitals:   09/03/17 2215 09/03/17 2230  BP: 124/76 120/82  Pulse: 76 76  Resp: 15 17  Temp:    SpO2: 100% 100%    Last Pain:  Vitals:   09/03/17 2230  TempSrc:   PainSc: 0-No pain   Pain Goal: Patients Stated Pain Goal: 5 (09/03/17 0744)               [name] A.

## 2017-09-03 NOTE — Transfer of Care (Signed)
Immediate Anesthesia Transfer of Care Note  Patient: Kristina Bauer  Procedure(s) Performed: POST PARTUM TUBAL LIGATION (Bilateral )  Patient Location: PACU  Anesthesia Type:Epidural  Level of Consciousness: awake, alert , oriented and patient cooperative  Airway & Oxygen Therapy: Patient Spontanous Breathing  Post-op Assessment: Report given to RN and Post -op Vital signs reviewed and stable  Post vital signs: Reviewed and stable  Last Vitals:  Vitals Value Taken Time  BP    Temp    Pulse 92 09/03/2017  9:52 PM  Resp 16 09/03/2017  9:52 PM  SpO2 98 % 09/03/2017  9:52 PM  Vitals shown include unvalidated device data.  Last Pain:  Vitals:   09/03/17 1920  TempSrc: Oral  PainSc:       Patients Stated Pain Goal: 5 (09/03/17 0744)  Complications: No apparent anesthesia complications

## 2017-09-03 NOTE — Anesthesia Preprocedure Evaluation (Addendum)
Anesthesia Evaluation  Patient identified by MRN, date of birth, ID band Patient awake    Reviewed: Allergy & Precautions, NPO status , Patient's Chart, lab work & pertinent test results  History of Anesthesia Complications (+) PONV and history of anesthetic complications  Airway Mallampati: III  TM Distance: >3 FB     Dental no notable dental hx. (+) Teeth Intact   Pulmonary asthma , sleep apnea , former smoker,    Pulmonary exam normal breath sounds clear to auscultation       Cardiovascular hypertension, Normal cardiovascular exam Rhythm:Regular Rate:Normal     Neuro/Psych  Headaches, PSYCHIATRIC DISORDERS Anxiety Depression  Neuromuscular disease    GI/Hepatic Neg liver ROS, GERD  ,  Endo/Other  diabetes, Well Controlled, GestationalMorbid obesity  Renal/GU negative Renal ROS     Musculoskeletal  (+) Fibromyalgia -  Abdominal (+) + obese,   Peds  Hematology negative hematology ROS (+)   Anesthesia Other Findings   Reproductive/Obstetrics Undesired fertility                            Lab Results  Component Value Date   WBC 12.8 (H) 09/03/2017   HGB 12.7 09/03/2017   HCT 36.8 09/03/2017   MCV 92.5 09/03/2017   PLT 381 09/03/2017    Anesthesia Physical  Anesthesia Plan  ASA: III  Anesthesia Plan: Epidural   Post-op Pain Management:    Induction:   PONV Risk Score and Plan: 4 or greater and Treatment may vary due to age or medical condition, Scopolamine patch - Pre-op, Ondansetron, Propofol infusion and Dexamethasone  Airway Management Planned: Natural Airway and Nasal Cannula  Additional Equipment:   Intra-op Plan:   Post-operative Plan:   Informed Consent: I have reviewed the patients History and Physical, chart, labs and discussed the procedure including the risks, benefits and alternatives for the proposed anesthesia with the patient or authorized representative  who has indicated his/her understanding and acceptance.   Dental advisory given  Plan Discussed with: CRNA and Surgeon  Anesthesia Plan Comments:         Anesthesia Quick Evaluation

## 2017-09-03 NOTE — Progress Notes (Signed)
Patient ID: Kristina Bauer, female   DOB: 12/17/1980, 37 y.o.   MRN: [id number]   H&P reviewed, no changes.  Reviewed w pt POC  AFVSS gen NAD FHTs140's mod var, + accels, category 1 toco irr  SVE 1.5/50/-2 - attempted AROM  Continue pitocin for IOL Follow GDM Also AMA, obese, PIH

## 2017-09-04 ENCOUNTER — Encounter (HOSPITAL_COMMUNITY): Payer: Self-pay | Admitting: Obstetrics and Gynecology

## 2017-09-04 LAB — GLUCOSE, CAPILLARY: GLUCOSE-CAPILLARY: 93 mg/dL (ref 65–99)

## 2017-09-04 LAB — CBC
HCT: 34.4 % — ABNORMAL LOW (ref 36.0–46.0)
Hemoglobin: 11.4 g/dL — ABNORMAL LOW (ref 12.0–15.0)
MCH: 31.2 pg (ref 26.0–34.0)
MCHC: 33.1 g/dL (ref 30.0–36.0)
MCV: 94.2 fL (ref 78.0–100.0)
PLATELETS: 346 10*3/uL (ref 150–400)
RBC: 3.65 MIL/uL — ABNORMAL LOW (ref 3.87–5.11)
RDW: 14.1 % (ref 11.5–15.5)
WBC: 13.1 10*3/uL — AB (ref 4.0–10.5)

## 2017-09-04 MED ORDER — HYDROMORPHONE HCL 1 MG/ML IJ SOLN
1.0000 mg | Freq: Once | INTRAMUSCULAR | Status: AC
  Administered 2017-09-04: 1 mg via INTRAVENOUS

## 2017-09-04 MED ORDER — OXYCODONE HCL 5 MG PO TABS
5.0000 mg | ORAL_TABLET | ORAL | Status: DC | PRN
  Administered 2017-09-04 – 2017-09-05 (×5): 5 mg via ORAL
  Filled 2017-09-04 (×6): qty 1

## 2017-09-04 MED ORDER — HYDROMORPHONE HCL 1 MG/ML IJ SOLN
INTRAMUSCULAR | Status: AC
  Administered 2017-09-04: 1 mg via INTRAVENOUS
  Filled 2017-09-04: qty 1

## 2017-09-04 NOTE — Progress Notes (Signed)
PPD #1, POD #1 BTL No problems Afeb, VSS Fundus firm, NT at U-1, dressing intact Continue routine postpartum care

## 2017-09-04 NOTE — Anesthesia Postprocedure Evaluation (Signed)
Anesthesia Post Note  Patient: Christell ConstantElizabeth R Kant  Procedure(s) Performed: AN AD HOC LABOR EPIDURAL     Patient location during evaluation: Mother Baby Anesthesia Type: Epidural Level of consciousness: awake and alert and oriented Pain management: satisfactory to patient Vital Signs Assessment: post-procedure vital signs reviewed and stable Respiratory status: spontaneous breathing and nonlabored ventilation Cardiovascular status: stable Postop Assessment: no headache, no backache, no signs of nausea or vomiting, adequate PO intake, patient able to bend at knees and epidural receding (patient up walking) Anesthetic complications: no    Last Vitals:  Vitals:   09/04/17 0100 09/04/17 0641  BP: 138/87 (!) 141/95  Pulse: 82 81  Resp: 16 18  Temp: 36.9 C 36.7 C  SpO2: 97% 97%    Last Pain:  Vitals:   09/04/17 1040  TempSrc:   PainSc: 6    Pain Goal: Patients Stated Pain Goal: 3 (09/04/17 1040)               Madison HickmanGREGORY,Mozetta Murfin

## 2017-09-04 NOTE — Lactation Note (Signed)
This note was copied from a baby's chart. Lactation Consultation Note Baby 9 hrs old. Mom states baby is BF ok. Denies painful latches. Mom has "V" shaped breast. Breast feel tight. Tender to touch. Mom couldn't tolerate hand expression Baby 37 weeks, discussed not BF longer than 30 min. Encouraged to wear shells in bra today and pre-pump prior to latching. Mom unable to tolerate hand expression. Encouraged to hand express and give colostrum or soften. Baby suckling on pacifier. Mom discouraged to give pacifiers, explained reasoning. RN has educated no to give pacifier. Mom encouraged to feed baby 8-12 times/24 hours and with feeding cues.  Mom may be tired, mom doesn't appear to be enthused over BF. Asked mom concerns. Mom stated she is fine. Encouraged to call for assistance WH/LC brochure given w/resources, support groups and LC services.          Patient Name: Kristina Bauer Reason for consult: Initial assessment;Early term 37-38.6wks   Maternal Data Has patient been taught Hand Expression?: Yes Does the patient have breastfeeding experience prior to this delivery?: No  Feeding Feeding Type: Breast Fed Length of feed: 8 min  LATCH Score Latch: Repeated attempts needed to sustain latch, nipple held in mouth throughout feeding, stimulation needed to elicit sucking reflex.  Audible Swallowing: Spontaneous and intermittent  Type of Nipple: Flat  Comfort (Breast/Nipple): Filling, red/small blisters or bruises, mild/mod discomfort(tender. edema)  Hold (Positioning): Assistance needed to correctly position infant at breast and maintain latch.  LATCH Score: 8  Interventions Interventions: Breast feeding basics reviewed;Support pillows;Position options;Skin to skin;Breast massage;Shells;Hand express;Pre-pump if needed;Hand pump;Adjust position  Lactation Tools Discussed/Used Tools: Shells;Pump Shell Type: Inverted Breast pump type: Manual Pump Review:  Setup, frequency, and cleaning;Milk Storage Initiated by:: Peri JeffersonL. Coleson Kant RN IBCLC Date initiated:: 09/04/17   Consult Status Consult Status: Follow-up Date: 09/04/17 Follow-up type: In-patient    Kristina Bauer, Kristina NickelLAURA G Bauer, 3:29 AM

## 2017-09-04 NOTE — Progress Notes (Signed)
MOB was referred for history of depression/anxiety. * Referral screened out by Clinical Social Worker because none of the following criteria appear to apply: ~ History of anxiety/depression during this pregnancy, or of post-partum depression. ~ Diagnosis of anxiety and/or depression within last 3 years OR * MOB's symptoms currently being treated with medication and/or therapy. Per OB record MOB plans to restart meds postpartum.  Please contact the Clinical Social Worker if needs arise, by MOB request, or if MOB scores greater than 9/yes to question 10 on Edinburgh Postpartum Depression Screen.  Tondra Reierson Boyd-Gilyard, MSW, LCSW Clinical Social Work [phone] 

## 2017-09-05 ENCOUNTER — Encounter (HOSPITAL_COMMUNITY): Payer: Self-pay

## 2017-09-05 ENCOUNTER — Inpatient Hospital Stay (HOSPITAL_COMMUNITY)
Admission: AD | Admit: 2017-09-05 | Discharge: 2017-09-05 | Disposition: A | Discharge status: Home / Self Care | Payer: Medicaid Other | Source: Ambulatory Visit | Attending: Obstetrics and Gynecology | Admitting: Obstetrics and Gynecology

## 2017-09-05 ENCOUNTER — Other Ambulatory Visit: Payer: Self-pay

## 2017-09-05 DIAGNOSIS — O99345 Other mental disorders complicating the puerperium: Secondary | ICD-10-CM | POA: Insufficient documentation

## 2017-09-05 DIAGNOSIS — Z8759 Personal history of other complications of pregnancy, childbirth and the puerperium: Secondary | ICD-10-CM

## 2017-09-05 DIAGNOSIS — F419 Anxiety disorder, unspecified: Secondary | ICD-10-CM | POA: Insufficient documentation

## 2017-09-05 LAB — COMPREHENSIVE METABOLIC PANEL
ALBUMIN: 2.8 g/dL — AB (ref 3.5–5.0)
ALK PHOS: 70 U/L (ref 38–126)
ALT: 13 U/L — ABNORMAL LOW (ref 14–54)
AST: 19 U/L (ref 15–41)
Anion gap: 10 (ref 5–15)
BILIRUBIN TOTAL: 0.5 mg/dL (ref 0.3–1.2)
BUN: 11 mg/dL (ref 6–20)
CALCIUM: 8.6 mg/dL — AB (ref 8.9–10.3)
CO2: 22 mmol/L (ref 22–32)
Chloride: 108 mmol/L (ref 101–111)
Creatinine, Ser: 0.64 mg/dL (ref 0.44–1.00)
GFR calc Af Amer: >60 mL/min (ref >60)
GFR calc non Af Amer: >60 mL/min (ref >60)
GLUCOSE: 110 mg/dL — AB (ref 65–99)
Potassium: 3.7 mmol/L (ref 3.5–5.1)
Sodium: 140 mmol/L (ref 135–145)
TOTAL PROTEIN: 6.2 g/dL — AB (ref 6.5–8.1)

## 2017-09-05 LAB — URINALYSIS, ROUTINE W REFLEX MICROSCOPIC
BACTERIA UA: NONE SEEN
Bilirubin Urine: NEGATIVE
Glucose, UA: NEGATIVE mg/dL
Ketones, ur: NEGATIVE mg/dL
NITRITE: NEGATIVE
PROTEIN: 30 mg/dL — AB
RBC / HPF: >50 RBC/hpf — ABNORMAL HIGH (ref 0–5)
Specific Gravity, Urine: 1.009 (ref 1.005–1.030)
pH: 7 (ref 5.0–8.0)

## 2017-09-05 LAB — CBC WITH DIFFERENTIAL/PLATELET
BASOS ABS: 0 10*3/uL (ref 0.0–0.1)
BASOS PCT: 0 %
Eosinophils Absolute: 0.2 10*3/uL (ref 0.0–0.7)
Eosinophils Relative: 2 %
HEMATOCRIT: 33.5 % — AB (ref 36.0–46.0)
HEMOGLOBIN: 11.2 g/dL — AB (ref 12.0–15.0)
Lymphocytes Relative: 24 %
Lymphs Abs: 2.4 10*3/uL (ref 0.7–4.0)
MCH: 31.5 pg (ref 26.0–34.0)
MCHC: 33.4 g/dL (ref 30.0–36.0)
MCV: 94.4 fL (ref 78.0–100.0)
MONOS PCT: 5 %
Monocytes Absolute: 0.5 10*3/uL (ref 0.1–1.0)
NEUTROS ABS: 7 10*3/uL (ref 1.7–7.7)
Neutrophils Relative %: 69 %
Platelets: 313 10*3/uL (ref 150–400)
RBC: 3.55 MIL/uL — ABNORMAL LOW (ref 3.87–5.11)
RDW: 13.8 % (ref 11.5–15.5)
WBC: 10.1 10*3/uL (ref 4.0–10.5)

## 2017-09-05 LAB — PROTEIN / CREATININE RATIO, URINE
Creatinine, Urine: 45 mg/dL
Total Protein, Urine: <6 mg/dL

## 2017-09-05 MED ORDER — FLUOXETINE HCL 20 MG PO CAPS: 20.0000 mg | ORAL_CAPSULE | Freq: Every day | ORAL | 6 refills | Status: AC

## 2017-09-05 MED ORDER — OXYCODONE HCL 5 MG PO TABS: 5.0000 mg | ORAL_TABLET | ORAL | 0 refills | Status: AC | PRN

## 2017-09-05 MED ORDER — IBUPROFEN 600 MG PO TABS: 600.0000 mg | ORAL_TABLET | Freq: Four times a day (QID) | ORAL | 0 refills | Status: AC

## 2017-09-05 NOTE — MAU Note (Addendum)
G4P3 PP pt released today after BTL. Started seeing spots so returned to the hospital. "floaters".  C/o of right side rib pain that has been on going.   EKG done prior to surgery and EKG was normal.   Serial BP started    2027: straight cath done per order and sent to lab

## 2017-09-05 NOTE — Progress Notes (Signed)
PPD #2, POD #2 BTL Doing ok, still sore Afeb, VSS Abd- soft, incision intact D/c home

## 2017-09-05 NOTE — MAU Provider Note (Signed)
History     CSN: [id number]  Arrival date and time: 09/05/17 1850   First Provider Initiated Contact with Patient 09/05/17 1933      Chief Complaint  Patient presents with  . Hypertension    seeing spots    HPI  Ms. Kristina Bauer is a 37 y.o. [phone] PPD #2 who presents to MAU today with complaint of elevated blood pressure and seeing spots. The patient states that she had possible pre-eclampsia, GHTN and GDM with her pregnancy. She states that today she took a nap and when she woke up she walked to the bathroom and was "seeing spots." she was very anxious about the spots and took her BP which was in the 190s/100s. She is not on any anti-hypertensive medications.    OB History    Gravida  4   Para  3   Term  3   Preterm      AB  1   Living  3     SAB  0   TAB  1   Ectopic      Multiple  0   Live Births  3           Past Medical History:  Diagnosis Date  . Abnormal Pap smear   . ADHD (attention deficit hyperactivity disorder)   . Anxiety   . Asthma   . Depression    hospitalized 2007 took zoloft  . Fibromyalgia   . GERD (gastroesophageal reflux disease)   . Gestational diabetes    metformin  . Headache(784.0)   . Herniated disc, cervical   . Hx of pre-eclampsia in prior pregnancy, currently pregnant   . IBS (irritable bowel syndrome)   . Normal pregnancy, repeat 07/08/2012  . Obesity   . PONV (postoperative nausea and vomiting)   . Pregnancy induced hypertension   . Sleep apnea   . SVD (spontaneous vaginal delivery) 07/09/2012  . SVD (spontaneous vaginal delivery) 09/03/2017  . Vaginal Pap smear, abnormal     Past Surgical History:  Procedure Laterality Date  . COLPOSCOPY    . TUBAL LIGATION Bilateral 09/03/2017   Procedure: POST PARTUM TUBAL LIGATION;  Surgeon: Sherian Rein, MD;  Location: [facility];  Service: Gynecology;  Laterality: Bilateral;  . wisdom teeth extracted      Family History  Problem Relation Age of Onset   . Anxiety disorder Mother   . Depression Mother   . Hypertension Mother   . Diabetes Father   . Hypertension Father   . Fibromyalgia Sister   . Diabetes Sister   . Cancer Maternal Grandmother        breast  . Diabetes Maternal Grandmother   . Heart disease Maternal Grandfather   . Cancer Paternal Grandmother        colon  . Diabetes Paternal Grandfather   . Cancer Paternal Grandfather        leukemia    Social History   Tobacco Use  . Smoking status: Former Smoker    Last attempt to quit: 01/09/2008    Years since quitting: 9.6  . Smokeless tobacco: Never Used  Substance Use Topics  . Alcohol use: No    Comment: not during pregnancy  . Drug use: No    Allergies:  Allergies  Allergen Reactions  . Shellfish Allergy Other (See Comments)    Tested positive for this allergy during screening, has never been exposed so reaction is unknown.  . Latex Rash    Medications Prior  to Admission  Medication Sig Dispense Refill Last Dose  . FLUoxetine (PROZAC) 20 MG capsule Take 1 capsule (20 mg total) by mouth daily. 30 capsule 6   . ibuprofen (ADVIL,MOTRIN) 600 MG tablet Take 1 tablet (600 mg total) by mouth every 6 (six) hours. 30 tablet 0   . oxyCODONE (OXY IR/ROXICODONE) 5 MG immediate release tablet Take 1 tablet (5 mg total) by mouth every 4 (four) hours as needed for moderate pain. 10 tablet 0   . Prenatal Vit-Fe Fumarate-FA (PRENATAL MULTIVITAMIN) TABS tablet Take 1 tablet by mouth daily at 12 noon.   09/02/2017 at Unknown time    Review of Systems  Constitutional: Negative for fever.  Eyes: Positive for visual disturbance.  Cardiovascular: Negative for leg swelling.  Gastrointestinal: Negative for abdominal pain.  Genitourinary: Positive for vaginal bleeding. Negative for dysuria, frequency, urgency and vaginal discharge.  Neurological: Negative for headaches.   Physical Exam   Blood pressure 134/79, pulse 77, temperature 98.6 F (37 C), temperature source Oral,  resp. rate 18, height 5\' 4"  (1.626 m), weight 273 lb (123.8 kg), last menstrual period 12/13/2016, unknown if currently breastfeeding.  Physical Exam  Nursing note and vitals reviewed. Constitutional: She is oriented to person, place, and time. She appears well-developed and well-nourished. No distress.  HENT:  Head: Normocephalic and atraumatic.  Cardiovascular: Normal rate.  Respiratory: Effort normal.  GI: Soft. She exhibits no distension and no mass. There is no tenderness. There is no rebound and no guarding.  Musculoskeletal: She exhibits no edema.  No clonus  Neurological: She is alert and oriented to person, place, and time. She has normal reflexes.  Skin: Skin is warm and dry. No erythema.  Psychiatric: She has a normal mood and affect.    Results for orders placed or performed during the hospital encounter of 09/05/17 (from the past 24 hour(s))  Urinalysis, Routine w reflex microscopic     Status: Abnormal   Collection Time: 09/05/17  6:58 PM  Result Value Ref Range   Color, Urine YELLOW YELLOW   APPearance HAZY (A) CLEAR   Specific Gravity, Urine 1.009 1.005 - 1.030   pH 7.0 5.0 - 8.0   Glucose, UA NEGATIVE NEGATIVE mg/dL   Hgb urine dipstick LARGE (A) NEGATIVE   Bilirubin Urine NEGATIVE NEGATIVE   Ketones, ur NEGATIVE NEGATIVE mg/dL   Protein, ur 30 (A) NEGATIVE mg/dL   Nitrite NEGATIVE NEGATIVE   Leukocytes, UA MODERATE (A) NEGATIVE   RBC / HPF >50 (H) 0 - 5 RBC/hpf   WBC, UA >50 (H) 0 - 5 WBC/hpf   Bacteria, UA NONE SEEN NONE SEEN   Squamous Epithelial / LPF 6-10 0 - 5   Mucus PRESENT   Protein / creatinine ratio, urine     Status: None   Collection Time: 09/05/17  7:33 PM  Result Value Ref Range   Creatinine, Urine 45.00 mg/dL   Total Protein, Urine <6.0 mg/dL   Protein Creatinine Ratio        0.00 - 0.15 mg/mg[Cre]  CBC with Differential/Platelet     Status: Abnormal   Collection Time: 09/05/17  7:50 PM  Result Value Ref Range   WBC 10.1 4.0 - 10.5 K/uL    RBC 3.55 (L) 3.87 - 5.11 MIL/uL   Hemoglobin 11.2 (L) 12.0 - 15.0 g/dL   HCT 47.833.5 (L) 29.536.0 - 62.146.0 %   MCV 94.4 78.0 - 100.0 fL   MCH 31.5 26.0 - 34.0 pg   MCHC 33.4 30.0 -  36.0 g/dL   RDW 40.9 81.1 - 91.4 %   Platelets 313 150 - 400 K/uL   Neutrophils Relative % 69 %   Neutro Abs 7.0 1.7 - 7.7 K/uL   Lymphocytes Relative 24 %   Lymphs Abs 2.4 0.7 - 4.0 K/uL   Monocytes Relative 5 %   Monocytes Absolute 0.5 0.1 - 1.0 K/uL   Eosinophils Relative 2 %   Eosinophils Absolute 0.2 0.0 - 0.7 K/uL   Basophils Relative 0 %   Basophils Absolute 0.0 0.0 - 0.1 K/uL  Comprehensive metabolic panel     Status: Abnormal   Collection Time: 09/05/17  7:50 PM  Result Value Ref Range   Sodium 140 135 - 145 mmol/L   Potassium 3.7 3.5 - 5.1 mmol/L   Chloride 108 101 - 111 mmol/L   CO2 22 22 - 32 mmol/L   Glucose, Bld 110 (H) 65 - 99 mg/dL   BUN 11 6 - 20 mg/dL   Creatinine, Ser 7.82 0.44 - 1.00 mg/dL   Calcium 8.6 (L) 8.9 - 10.3 mg/dL   Total Protein 6.2 (L) 6.5 - 8.1 g/dL   Albumin 2.8 (L) 3.5 - 5.0 g/dL   AST 19 15 - 41 U/L   ALT 13 (L) 14 - 54 U/L   Alkaline Phosphatase 70 38 - 126 U/L   Total Bilirubin 0.5 0.3 - 1.2 mg/dL   GFR calc non Af Amer >60 >60 mL/min   GFR calc Af Amer >60 >60 mL/min   Anion gap 10 5 - 15    Patient Vitals for the past 24 hrs:  BP Temp Temp src Pulse Resp Height Weight  09/05/17 2045 134/79 - - 77 - - -  09/05/17 2030 136/69 - - 77 - - -  09/05/17 2015 133/69 - - 82 - - -  09/05/17 2000 140/71 - - 82 - - -  09/05/17 1947 138/76 - - 81 - - -  09/05/17 1930 (!) 150/88 - - 86 - - -  09/05/17 1916 (!) 151/87 98.6 F (37 C) Oral 90 18 - -  09/05/17 1900 - - - - - 5\' 4"  (1.626 m) 273 lb (123.8 kg)    MAU Course  Procedures None  MDM Serial BPs CBC, CMP, UA and urine protein/creatinine ratio  Discussed patient with Dr. Jackelyn Knife. Ok for discharge at this time. Advised patient to call the office for a BP check this week.  Assessment and Plan  A: PPD  #2 History of GTHN Anxiety   P: Discharge home Warning signs for pre-eclampsia discussed Patient advised to follow-up with [facility] this week for BP check Patient may return to MAU as needed or if her condition were to change or worsen  Vonzella Nipple, PA-C 09/05/2017, 9:41 PM

## 2017-09-05 NOTE — Discharge Instructions (Signed)
Preeclampsia and Eclampsia °Preeclampsia is a serious condition that develops only during pregnancy. It is also called toxemia of pregnancy. This condition causes high blood pressure along with other symptoms, such as swelling and headaches. These symptoms may develop as the condition gets worse. Preeclampsia may occur at 20 weeks of pregnancy or later. °Diagnosing and treating preeclampsia early is very important. If not treated early, it can cause serious problems for you and your baby. One problem it can lead to is eclampsia, which is a condition that causes muscle jerking or shaking (convulsions or seizures) in the mother. Delivering your baby is the best treatment for preeclampsia or eclampsia. Preeclampsia and eclampsia symptoms usually go away after your baby is born. °What are the causes? °The cause of preeclampsia is not known. °What increases the risk? °The following risk factors make you more likely to develop preeclampsia: °· Being pregnant for the first time. °· Having had preeclampsia during a past pregnancy. °· Having a family history of preeclampsia. °· Having high blood pressure. °· Being pregnant with twins or triplets. °· Being 35 or older. °· Being African-American. °· Having kidney disease or diabetes. °· Having medical conditions such as lupus or blood diseases. °· Being very overweight (obese). ° °What are the signs or symptoms? °The earliest signs of preeclampsia are: °· High blood pressure. °· Increased protein in your urine. Your health care provider will check for this at every visit before you give birth (prenatal visit). ° °Other symptoms that may develop as the condition gets worse include: °· Severe headaches. °· Sudden weight gain. °· Swelling of the hands, face, legs, and feet. °· Nausea and vomiting. °· Vision problems, such as blurred or double vision. °· Numbness in the face, arms, legs, and feet. °· Urinating less than usual. °· Dizziness. °· Slurred speech. °· Abdominal pain,  especially upper abdominal pain. °· Convulsions or seizures. ° °Symptoms generally go away after giving birth. °How is this diagnosed? °There are no screening tests for preeclampsia. Your health care provider will ask you about symptoms and check for signs of preeclampsia during your prenatal visits. You may also have tests that include: °· Urine tests. °· Blood tests. °· Checking your blood pressure. °· Monitoring your baby’s heart rate. °· Ultrasound. ° °How is this treated? °You and your health care provider will determine the treatment approach that is best for you. Treatment may include: °· Having more frequent prenatal exams to check for signs of preeclampsia, if you have an increased risk for preeclampsia. °· Bed rest. °· Reducing how much salt (sodium) you eat. °· Medicine to lower your blood pressure. °· Staying in the hospital, if your condition is severe. There, treatment will focus on controlling your blood pressure and the amount of fluids in your body (fluid retention). °· You may need to take medicine (magnesium sulfate) to prevent seizures. This medicine may be given as an injection or through an IV tube. °· Delivering your baby early, if your condition gets worse. You may have your labor started with medicine (induced), or you may have a cesarean delivery. ° °Follow these instructions at home: °Eating and drinking ° °· Drink enough fluid to keep your urine clear or pale yellow. °· Eat a healthy diet that is low in sodium. Do not add salt to your food. Check nutrition labels to see how much sodium a food or beverage contains. °· Avoid caffeine. °Lifestyle °· Do not use any products that contain nicotine or tobacco, such as cigarettes   and e-cigarettes. If you need help quitting, ask your health care provider. °· Do not use alcohol or drugs. °· Avoid stress as much as possible. Rest and get plenty of sleep. °General instructions °· Take over-the-counter and prescription medicines only as told by your  health care provider. °· When lying down, lie on your side. This keeps pressure off of your baby. °· When sitting or lying down, raise (elevate) your feet. Try putting some pillows underneath your lower legs. °· Exercise regularly. Ask your health care provider what kinds of exercise are best for you. °· Keep all follow-up and prenatal visits as told by your health care provider. This is important. °How is this prevented? °To prevent preeclampsia or eclampsia from developing during another pregnancy: °· Get proper medical care during pregnancy. Your health care provider may be able to prevent preeclampsia or diagnose and treat it early. °· Your health care provider may have you take a low-dose aspirin or a calcium supplement during your next pregnancy. °· You may have tests of your blood pressure and kidney function after giving birth. °· Maintain a healthy weight. Ask your health care provider for help managing weight gain during pregnancy. °· Work with your health care provider to manage any long-term (chronic) health conditions you have, such as diabetes or kidney problems. ° °Contact a health care provider if: °· You gain more weight than expected. °· You have headaches. °· You have nausea or vomiting. °· You have abdominal pain. °· You feel dizzy or light-headed. °Get help right away if: °· You develop sudden or severe swelling anywhere in your body. This usually happens in the legs. °· You gain 5 lbs (2.3 kg) or more during one week. °· You have severe: °? Abdominal pain. °? Headaches. °? Dizziness. °? Vision problems. °? Confusion. °? Nausea or vomiting. °· You have a seizure. °· You have trouble moving any part of your body. °· You develop numbness in any part of your body. °· You have trouble speaking. °· You have any abnormal bleeding. °· You pass out. °This information is not intended to replace advice given to you by your health care provider. Make sure you discuss any questions you have with your health  care provider. °Document Released: 03/14/2000 Document Revised: 11/13/2015 Document Reviewed: 10/22/2015 °Elsevier Interactive Patient Education © 2018 [employer] ° °

## 2017-09-05 NOTE — Discharge Summary (Signed)
OB Discharge Summary     Patient Name: Kristina Bauer DOB: 03/17/1981 MRN: [id number]  Date of admission: 09/03/2017 Delivering MD: Sherian Rein   Date of discharge: 09/05/2017  Admitting diagnosis: INDUCTION Intrauterine pregnancy: [redacted]w[redacted]d     Secondary diagnosis:  Principal Problem:   SVD (spontaneous vaginal delivery) Active Problems:   Gestational diabetes mellitus (GDM) controlled on oral hypoglycemic drug      Discharge diagnosis: Term Pregnancy Delivered, Gestational Hypertension and GDM A2                                                                                                Post partum procedures:postpartum tubal ligation  Hospital course:  Induction of Labor With Vaginal Delivery   37 y.o. yo [phone] at [redacted]w[redacted]d was admitted to the hospital 09/03/2017 for induction of labor.  Indication for induction: Gestational hypertension and A2 DM.  Patient had an uncomplicated labor course as follows: Membrane Rupture Time/Date: 12:28 PM ,09/03/2017   Intrapartum Procedures: Episiotomy: None [1]                                         Lacerations:  2nd degree [3];Labial [10]  Patient had delivery of a Viable infant.  Information for the patient's newborn:  Aylyn, Wenzler Girl Katalena [[id number]]  Delivery Method: Vaginal, Spontaneous(Filed from Delivery Summary)   09/03/2017  Details of delivery can be found in separate delivery note.  Patient had a routine postpartum course, PPBTL with Filshie clips on day of delivery. Patient is discharged home 09/05/17.  Physical exam  Vitals:   09/04/17 0100 09/04/17 0641 09/04/17 1829 09/05/17 0632  BP: 138/87 (!) 141/95 (!) 113/93 140/88  Pulse: 82 81 77 80  Resp: 16 18 20 18   Temp: 98.4 F (36.9 C) 98 F (36.7 C) 97.6 F (36.4 C) 98.3 F (36.8 C)  TempSrc: Oral  Oral   SpO2: 97% 97% 100%   Weight:      Height:       General: alert Lochia: appropriate Uterine Fundus: firm Incision: Healing well with no significant  drainage  Labs: Lab Results  Component Value Date   WBC 13.1 (H) 09/04/2017   HGB 11.4 (L) 09/04/2017   HCT 34.4 (L) 09/04/2017   MCV 94.2 09/04/2017   PLT 346 09/04/2017   CMP Latest Ref Rng & Units 09/03/2017  Glucose 65 - 99 mg/dL 95  BUN 6 - 20 mg/dL 15  Creatinine 4.40 - 1.02 mg/dL 7.25  Sodium 366 - 440 mmol/L 135  Potassium 3.5 - 5.1 mmol/L 4.2  Chloride 101 - 111 mmol/L 107  CO2 22 - 32 mmol/L 18(L)  Calcium 8.9 - 10.3 mg/dL 9.1  Total Protein 6.5 - 8.1 g/dL 6.2(L)  Total Bilirubin 0.3 - 1.2 mg/dL 0.5  Alkaline Phos 38 - 126 U/L 92  AST 15 - 41 U/L 19  ALT 14 - 54 U/L 12(L)    Discharge instruction: per After Visit Summary and "Baby and Me Booklet".  After visit meds:  Allergies as of 09/05/2017      Reactions   Shellfish Allergy Other (See Comments)   Tested positive for this allergy during screening, has never been exposed so reaction is unknown.   Latex Rash      Medication List    STOP taking these medications   metFORMIN 500 MG tablet Commonly known as:  GLUCOPHAGE   OVER THE COUNTER MEDICATION     TAKE these medications   FLUoxetine 20 MG capsule Commonly known as:  PROZAC Take 1 capsule (20 mg total) by mouth daily.   ibuprofen 600 MG tablet Commonly known as:  ADVIL,MOTRIN Take 1 tablet (600 mg total) by mouth every 6 (six) hours.   oxyCODONE 5 MG immediate release tablet Commonly known as:  Oxy IR/ROXICODONE Take 1 tablet (5 mg total) by mouth every 4 (four) hours as needed for moderate pain.   prenatal multivitamin Tabs tablet Take 1 tablet by mouth daily at 12 noon.       Diet: routine diet  Activity: Advance as tolerated. Pelvic rest for 6 weeks.   Outpatient follow up:2 weeks  Newborn Data: Live born female  Birth Weight: 6 lb 7 oz (2920 g) APGAR: 9, 9  Newborn Delivery   Birth date/time:  09/03/2017 18:26:00 Delivery type:  Vaginal, Spontaneous     Baby Feeding: Breast Disposition:home with mother   09/05/2017 Zenaida Nieceodd D  Harshitha Fretz, MD

## 2017-09-05 NOTE — MAU Note (Signed)
Pt presents for BP eval.  Reports had NSVD on 09/03/2017 and discharged today.  Reports had Pre Eclampsia with the pregnancy & was IOL d/t ^HTN.

## 2017-09-05 NOTE — Discharge Instructions (Signed)
As per discharge pamphlet °

## 2017-09-06 ENCOUNTER — Inpatient Hospital Stay (HOSPITAL_COMMUNITY)
Admission: AD | Admit: 2017-09-06 | Payer: Medicaid Other | Source: Ambulatory Visit | Admitting: Obstetrics and Gynecology

## 2017-09-07 ENCOUNTER — Encounter (HOSPITAL_COMMUNITY): Payer: Medicaid Other

## 2017-09-08 NOTE — Op Note (Signed)
NAME: Kristina Bauer, Kristina R. MEDICAL RECORD [id number] ACCOUNT [id number] DATE OF BIRTH:Jun 08, 1980 FACILITY: [facility] LOCATION: [id number] PHYSICIAN:Nicholos Aloisi BOVARD-STUCKERT, MD  OPERATIVE REPORT  DATE OF PROCEDURE:  09/03/2017  PREOPERATIVE DIAGNOSIS:  Status post spontaneous vaginal delivery, desires sterilization, postpartum tubal ligation.  POSTOPERATIVE DIAGNOSIS:  Status post spontaneous vaginal delivery, desires sterilization, postpartum tubal ligation by Filshie clip.  PROCEDURE:  Postpartum bilateral tubal ligation by Filshie clip.  SURGEON:  Sherron MondayJody Bovard, M.D.  ANESTHESIA:  Epidural and local anesthetic.  ESTIMATED BLOOD LOSS:  Approximately 5 mL   URINE OUTPUT AND IV FLUIDS:  Per anesthesia notes.  COMPLICATIONS:  Inability to resect a portion of the tube.  The patient was treated with Filshie clips instead.  PATHOLOGY:  None.  DESCRIPTION OF PROCEDURE:  After informed consent with the patient including risks, benefits and alternatives of the surgical procedure, her epidural was dosed.  After given the appropriate level, she was prepped and draped in the normal sterile fashion.   Foley catheter was placed.  An approximately 2 cm infraumbilical incision was made.  Fascia was cleared off and elevated with Kocher clamps and then incised.  Entry into the peritoneum was performed.  Using small retractors and air planing the patient,  her tubes were located, first on the patient's left side, followed out to the fimbriated end.  However, the attempt was made to doubly ligate and the portion that was excised was less than a centimeter.  The decision was made to proceed with Filshie  clamps instead.  Two Filshie clips were placed in the right tube in a similar fashion.  It was identified followed to the fimbriated end and 2 Filshie clips were applied.  The fascia was reapproximated with 0 Vicryl.  The subcuticular adipose layer was  made hemostatic and sutures placed.  The skin was closed with  3-0 Vicryl in a subcuticular fashion.  Benzoin and steris were applied.    The patient was transported in stable condition to the PACU.  AN/NUANCE  D:09/07/2017 T:09/08/2017 JOB:[id number]

## 2018-11-05 ENCOUNTER — Other Ambulatory Visit: Payer: Self-pay | Admitting: *Deleted

## 2018-11-05 DIAGNOSIS — Z20822 Contact with and (suspected) exposure to covid-19: Secondary | ICD-10-CM

## 2018-11-06 LAB — NOVEL CORONAVIRUS, NAA: SARS-CoV-2, NAA: NOT DETECTED

## 2023-08-07 ENCOUNTER — Other Ambulatory Visit: Payer: Self-pay | Admitting: Medical Genetics

## 2023-08-13 ENCOUNTER — Other Ambulatory Visit (HOSPITAL_COMMUNITY)
Admission: RE | Admit: 2023-08-13 | Discharge: 2023-08-13 | Disposition: A | Discharge status: Home / Self Care | Payer: Self-pay | Source: Ambulatory Visit | Attending: Medical Genetics | Admitting: Medical Genetics

## 2023-08-21 LAB — GENECONNECT MOLECULAR SCREEN: Genetic Analysis Overall Interpretation: NEGATIVE
